# Patient Record
Sex: Male | Born: 1965
Health system: Southern US, Community
[De-identification: ages and names within clinical notes are randomized; demographics above are authoritative.]

## PROBLEM LIST (undated history)

## (undated) DIAGNOSIS — F1910 Other psychoactive substance abuse, uncomplicated: Secondary | ICD-10-CM

## (undated) DIAGNOSIS — F1020 Alcohol dependence, uncomplicated: Secondary | ICD-10-CM

## (undated) DIAGNOSIS — M549 Dorsalgia, unspecified: Secondary | ICD-10-CM

## (undated) DIAGNOSIS — I10 Essential (primary) hypertension: Secondary | ICD-10-CM

## (undated) DIAGNOSIS — E119 Type 2 diabetes mellitus without complications: Secondary | ICD-10-CM

## (undated) DIAGNOSIS — E785 Hyperlipidemia, unspecified: Secondary | ICD-10-CM

## (undated) DIAGNOSIS — I639 Cerebral infarction, unspecified: Secondary | ICD-10-CM

## (undated) HISTORY — DX: Hyperlipidemia, unspecified: E78.5

## (undated) HISTORY — DX: Type 2 diabetes mellitus without complications: E11.9

## (undated) HISTORY — DX: Dorsalgia, unspecified: M54.9

## (undated) HISTORY — PX: HERNIA REPAIR: SHX51

## (undated) HISTORY — PX: WISDOM TOOTH EXTRACTION: SHX21

---

## 2017-10-05 ENCOUNTER — Ambulatory Visit (HOSPITAL_COMMUNITY)
Admission: EM | Admit: 2017-10-05 | Discharge: 2017-10-05 | Disposition: A | Discharge status: Home / Self Care | Payer: Medicaid Other | Attending: Family Medicine | Admitting: Family Medicine

## 2017-10-05 ENCOUNTER — Encounter (HOSPITAL_COMMUNITY): Payer: Self-pay | Admitting: Emergency Medicine

## 2017-10-05 ENCOUNTER — Other Ambulatory Visit: Payer: Self-pay

## 2017-10-05 DIAGNOSIS — H1033 Unspecified acute conjunctivitis, bilateral: Secondary | ICD-10-CM

## 2017-10-05 DIAGNOSIS — R0609 Other forms of dyspnea: Secondary | ICD-10-CM

## 2017-10-05 DIAGNOSIS — R202 Paresthesia of skin: Secondary | ICD-10-CM

## 2017-10-05 HISTORY — DX: Essential (primary) hypertension: I10

## 2017-10-05 HISTORY — DX: Cerebral infarction, unspecified: I63.9

## 2017-10-05 HISTORY — DX: Alcohol dependence, uncomplicated: F10.20

## 2017-10-05 MED ORDER — POLYETHYL GLYCOL-PROPYL GLYCOL 0.4-0.3 % OP SOLN: 1.0000 [drp] | Freq: Four times a day (QID) | OPHTHALMIC | 0 refills | Status: DC | PRN

## 2017-10-05 MED ORDER — OFLOXACIN 0.3 % OP SOLN: OPHTHALMIC | 0 refills | Status: DC

## 2017-10-05 MED ORDER — POLYETHYL GLYCOL-PROPYL GLYCOL 0.4-0.3 % OP GEL: 1.0000 "application " | Freq: Every evening | OPHTHALMIC | 0 refills | Status: DC | PRN

## 2017-10-05 NOTE — ED Triage Notes (Signed)
Pt reports blurred vision that started on Sunday, he also reports drainage to the right eye, and crusted over when he woke up.  He now states he does not have any drainage, more dryness of the eyes, but still complains of blurriness.  He has been in an alcohol rehab program for the last seven months and has no PCP.  He is here with several issues.  He reports SOB with exertion and he also reports numbness in the tips of his fingers bilaterally.

## 2017-10-05 NOTE — Discharge Instructions (Signed)
Use ofloxacin eyedrops as directed on both eyes. Artificial tear drops during the day. Artificial tear gel at night. Wait 10-15 minutes between drops, always use artificial tear gel last, as it prevents drops from penetrating through. Lid scrubs and warm compresses as directed. Monitor for any worsening of symptoms, changes in vision, sensitivity to light, eye swelling, follow up with ophthalmology for further evaluation.   As we discussed, shortness of breath during activity could be due to deconditioning. Other things to consider would be snoring, heart disease, weakness of lungs from smoking. Please go to the emergency department if you experience chest pain, weakness, dizziness, passing out.  Your numbness and tingling of the fingers could be due to inflammation from activity, nerve impingement, diabetes. You can take ibuprofen 400mg  three times a day for 5-10 days to help with the inflammation. Please try to sleep without your arms under the pillow.   Both for your shortness of breath and numbness in your fingers will need further workup. Please follow up with primary care provider for further evaluation and treatment needed.

## 2017-10-05 NOTE — ED Provider Notes (Signed)
[facility]    CSN: [id number] Arrival date & time: 10/05/17  1034     History   Chief Complaint Chief Complaint  Patient presents with  . Eye Problem    bilateral  . Shortness of Breath  . Numbness    HPI Tommy House is a 52 y.o. male.   52 year old male comes in for multiple problems.  1.  4-day history of red eyes and discharge.  Patient states he has had crusting in the morning, and generalized blurry vision.  Denies eye pain, photophobia.  Denies injury/trauma.  States discharge has slightly improved.  He has been applying OTC eyedrops with some relief.  States that he feels his eyes are dry and itchy.  Denies URI symptoms such as cough, congestion, sore throat.  Denies fever, chills, night sweats.  States contact lens use in the past.  Wears glasses currently, last updated last year.  2.  Shortness of breath on exertion.  Patient states he has been in rehab facility for the past few months,   And has had a sedentary lifestyle, with 30 pound weight gain.  He has noticed increased shortness of breath climbing stairs for the past few months.  Denies chest pain, weakness, dizziness, syncope.  Denies personal history of heart disease.  He endorses snoring at night with possibly apnea. Has extensive smoking history of tobacco and THC. History of cocaine use.  Denies orthopnea, swelling of the legs.  3.  Numbness and tingling of the fingers.  Patient states that this has been progressively worse throughout the past few months. He states worse at night. He does sleep with his head on one of his arms, but he states experiences bilateral numbness/tingling. Denies neck, shoulder, wrist pain. Denies injury. Positive family history of diabetes, with personal history of prediabetes. He has not tried anything for the symptoms.       Past Medical History:  Diagnosis Date  . Alcoholism (HCC)   . Hypertension   . Stroke [facility])     There are no active problems to display for  this patient.   Past Surgical History:  Procedure Laterality Date  . HERNIA REPAIR    . WISDOM TOOTH EXTRACTION         Home Medications    Prior to Admission medications   Medication Sig Start Date End Date Taking? Authorizing Provider  ibuprofen (ADVIL,MOTRIN) 200 MG tablet Take 200 mg by mouth every 6 (six) hours as needed.   Yes [provider]  lamoTRIgine (LAMICTAL) 150 MG tablet Take 300 mg by mouth daily.   Yes [provider]  ofloxacin (OCUFLOX) 0.3 % ophthalmic solution Apply 1 drop every 4 hours to both eyes for the first 2 days, then 1 drop in both eyes 4 times a day for 5 days. 10/05/17   Cathie Hoops, Ashling Roane V, PA-C  Polyethyl Glycol-Propyl Glycol (SYSTANE) 0.4-0.3 % GEL ophthalmic gel Place 1 application into both eyes at bedtime as needed. 10/05/17   Cathie Hoops, Laurin Paulo V, PA-C  Polyethyl Glycol-Propyl Glycol (SYSTANE) 0.4-0.3 % SOLN Apply 1-2 drops to eye 4 (four) times daily as needed. 10/05/17   Belinda Fisher, PA-C    Family History History reviewed. No pertinent family history.  Social History Social History   Tobacco Use  . Smoking status: Never Smoker  . Smokeless tobacco: Never Used  Substance Use Topics  . Alcohol use: No    Frequency: Never  . Drug use: No     Allergies  Patient has no known allergies.   Review of Systems Review of Systems  Reason unable to perform ROS: See HPI as above.     Physical Exam Triage Vital Signs ED Triage Vitals  Enc Vitals Group     BP 10/05/17 1128 (!) 150/96     Pulse Rate 10/05/17 1128 94     Resp --      Temp 10/05/17 1128 99 F (37.2 C)     Temp Source 10/05/17 1128 Oral     SpO2 10/05/17 1128 98 %     Weight --      Height --      Head Circumference --      Peak Flow --      Pain Score 10/05/17 1130 0     Pain Loc --      Pain Edu? --      Excl. in GC? --    No data found.  Updated Vital Signs BP (!) 150/96 (BP Location: Left Arm)   Pulse 94   Temp 99 F (37.2 C) (Oral)   SpO2 98%   Visual  Acuity Right Eye Distance:   20/20 (CC) Left Eye Distance:   20/20 (CC) Bilateral Distance:    Right Eye Near:   Left Eye Near:    Bilateral Near:     Physical Exam  Constitutional: He is oriented to person, place, and time. He appears well-developed and well-nourished. No distress.  HENT:  Head: Normocephalic and atraumatic.  Eyes: EOM and lids are normal. Pupils are equal, round, and reactive to light. Lids are everted and swept, no foreign bodies found. Right eye exhibits no discharge. Left eye exhibits no discharge. Right conjunctiva is injected. Left conjunctiva is injected.  No ciliary injection.  Neck: Normal range of motion. Neck supple. No spinous process tenderness and no muscular tenderness present. Normal range of motion present.  Cardiovascular: Normal rate, regular rhythm and normal heart sounds. Exam reveals no gallop and no friction rub.  No murmur heard. Pulmonary/Chest: Effort normal and breath sounds normal. No stridor. He has no decreased breath sounds. He has no wheezes. He has no rales.  Musculoskeletal:  No tenderness on palpation of the spinous processes, shoulders, wrist. Full range of motion of neck, shoulder, wrist, fingers. Strength normal and equal bilaterally. Sensation intact and equal bilaterally.  Radial pulses 2+ and equal bilaterally. Capillary refill less than 2 seconds.   Positive Phalen's.  Negative Tinel's, Finkelstein.  Neurological: He is alert and oriented to person, place, and time.  Skin: Skin is warm and dry.     UC Treatments / Results  Labs (all labs ordered are listed, but only abnormal results are displayed) Labs Reviewed - No data to display  EKG  EKG Interpretation None       Radiology No results found.  Procedures Procedures (including critical care time)  Medications Ordered in UC Medications - No data to display   Initial Impression / Assessment and Plan / UC Course  I have reviewed the triage vital signs and  the nursing notes.  Pertinent labs & imaging results that were available during my care of the patient were reviewed by me and considered in my medical decision making (see chart for details).    Start ofloxacin drops as directed. Artificial tears gel as directed. Lid scrubs and warm compresses as directed. Patient to follow up with ophthalmology if symptoms worsens or does not improve. Return precautions given.   Discussed with patient no alarming  signs on exam today.  Will have to have further workup for dyspnea on exertion as well as paresthesias of the fingers.  Patient try NSAIDs to help with inflammation.  Continued cessation of smoking encouraged.  Patient to follow-up with PCP for further workup and treatment needed.  Return precautions given.  Patient expresses understanding and agrees to plan.   Final Clinical Impressions(s) / UC Diagnoses   Final diagnoses:  Acute conjunctivitis of both eyes, unspecified acute conjunctivitis type  Paresthesia  DOE (dyspnea on exertion)    ED Discharge Orders        Ordered    ofloxacin (OCUFLOX) 0.3 % ophthalmic solution     10/05/17 1215    Polyethyl Glycol-Propyl Glycol (SYSTANE) 0.4-0.3 % GEL ophthalmic gel  At bedtime PRN     10/05/17 1215    Polyethyl Glycol-Propyl Glycol (SYSTANE) 0.4-0.3 % SOLN  4 times daily PRN     10/05/17 1215        Belinda Fisher, PA-C 10/05/17 1445

## 2017-11-01 ENCOUNTER — Emergency Department (HOSPITAL_BASED_OUTPATIENT_CLINIC_OR_DEPARTMENT_OTHER)
Admission: EM | Admit: 2017-11-01 | Discharge: 2017-11-01 | Disposition: A | Discharge status: Home / Self Care | Payer: Medicaid Other | Attending: Emergency Medicine | Admitting: Emergency Medicine

## 2017-11-01 ENCOUNTER — Other Ambulatory Visit: Payer: Self-pay

## 2017-11-01 ENCOUNTER — Encounter (HOSPITAL_BASED_OUTPATIENT_CLINIC_OR_DEPARTMENT_OTHER): Payer: Self-pay | Admitting: Emergency Medicine

## 2017-11-01 ENCOUNTER — Emergency Department (HOSPITAL_BASED_OUTPATIENT_CLINIC_OR_DEPARTMENT_OTHER): Payer: Medicaid Other

## 2017-11-01 DIAGNOSIS — R350 Frequency of micturition: Secondary | ICD-10-CM | POA: Diagnosis not present

## 2017-11-01 DIAGNOSIS — E119 Type 2 diabetes mellitus without complications: Secondary | ICD-10-CM | POA: Diagnosis not present

## 2017-11-01 DIAGNOSIS — Z8673 Personal history of transient ischemic attack (TIA), and cerebral infarction without residual deficits: Secondary | ICD-10-CM | POA: Insufficient documentation

## 2017-11-01 DIAGNOSIS — Z87891 Personal history of nicotine dependence: Secondary | ICD-10-CM | POA: Diagnosis not present

## 2017-11-01 DIAGNOSIS — Z79899 Other long term (current) drug therapy: Secondary | ICD-10-CM | POA: Diagnosis not present

## 2017-11-01 DIAGNOSIS — I10 Essential (primary) hypertension: Secondary | ICD-10-CM | POA: Diagnosis not present

## 2017-11-01 DIAGNOSIS — R42 Dizziness and giddiness: Secondary | ICD-10-CM | POA: Diagnosis not present

## 2017-11-01 DIAGNOSIS — R631 Polydipsia: Secondary | ICD-10-CM | POA: Diagnosis not present

## 2017-11-01 DIAGNOSIS — E1165 Type 2 diabetes mellitus with hyperglycemia: Secondary | ICD-10-CM | POA: Insufficient documentation

## 2017-11-01 DIAGNOSIS — R200 Anesthesia of skin: Secondary | ICD-10-CM | POA: Diagnosis not present

## 2017-11-01 DIAGNOSIS — H538 Other visual disturbances: Secondary | ICD-10-CM | POA: Diagnosis present

## 2017-11-01 HISTORY — DX: Other psychoactive substance abuse, uncomplicated: F19.10

## 2017-11-01 LAB — CBG MONITORING, ED
Glucose-Capillary: 535 mg/dL (ref 65–99)
Glucose-Capillary: 338 mg/dL — ABNORMAL HIGH (ref 65–99)

## 2017-11-01 LAB — COMPREHENSIVE METABOLIC PANEL
ALBUMIN: 4.4 g/dL (ref 3.5–5.0)
ALK PHOS: 148 U/L — AB (ref 38–126)
ALT: 53 U/L (ref 17–63)
AST: 22 U/L (ref 15–41)
Anion gap: 12 (ref 5–15)
BUN: 21 mg/dL — AB (ref 6–20)
CHLORIDE: 94 mmol/L — AB (ref 101–111)
CO2: 25 mmol/L (ref 22–32)
CREATININE: 1.05 mg/dL (ref 0.61–1.24)
Calcium: 9.5 mg/dL (ref 8.9–10.3)
GFR calc Af Amer: >60 mL/min (ref >60)
GFR calc non Af Amer: >60 mL/min (ref >60)
GLUCOSE: 509 mg/dL — AB (ref 65–99)
Potassium: 4 mmol/L (ref 3.5–5.1)
SODIUM: 131 mmol/L — AB (ref 135–145)
Total Bilirubin: 1.4 mg/dL — ABNORMAL HIGH (ref 0.3–1.2)
Total Protein: 7.5 g/dL (ref 6.5–8.1)

## 2017-11-01 LAB — CBC WITH DIFFERENTIAL/PLATELET
Basophils Absolute: 0 10*3/uL (ref 0.0–0.1)
Basophils Relative: 1 %
EOS ABS: 0.1 10*3/uL (ref 0.0–0.7)
EOS PCT: 1 %
HCT: 43.4 % (ref 39.0–52.0)
HEMOGLOBIN: 16 g/dL (ref 13.0–17.0)
LYMPHS ABS: 1.7 10*3/uL (ref 0.7–4.0)
Lymphocytes Relative: 26 %
MCH: 29.1 pg (ref 26.0–34.0)
MCHC: 36.9 g/dL — AB (ref 30.0–36.0)
MCV: 78.9 fL (ref 78.0–100.0)
MONO ABS: 0.8 10*3/uL (ref 0.1–1.0)
MONOS PCT: 13 %
NEUTROS PCT: 59 %
Neutro Abs: 3.9 10*3/uL (ref 1.7–7.7)
Platelets: 228 10*3/uL (ref 150–400)
RBC: 5.5 MIL/uL (ref 4.22–5.81)
RDW: 12.6 % (ref 11.5–15.5)
WBC: 6.6 10*3/uL (ref 4.0–10.5)

## 2017-11-01 LAB — URINALYSIS, ROUTINE W REFLEX MICROSCOPIC
BILIRUBIN URINE: NEGATIVE
Glucose, UA: >=500 mg/dL — AB
HGB URINE DIPSTICK: NEGATIVE
Ketones, ur: 15 mg/dL — AB
Leukocytes, UA: NEGATIVE
Nitrite: NEGATIVE
Protein, ur: NEGATIVE mg/dL
SPECIFIC GRAVITY, URINE: 1.01 (ref 1.005–1.030)
pH: 6 (ref 5.0–8.0)

## 2017-11-01 LAB — I-STAT VENOUS BLOOD GAS, ED
Bicarbonate: 24.9 mmol/L (ref 20.0–28.0)
O2 SAT: 69 %
PH VEN: 7.404 (ref 7.250–7.430)
PO2 VEN: 36 mmHg (ref 32.0–45.0)
TCO2: 26 mmol/L (ref 22–32)
pCO2, Ven: 39.7 mmHg — ABNORMAL LOW (ref 44.0–60.0)

## 2017-11-01 LAB — URINALYSIS, MICROSCOPIC (REFLEX)

## 2017-11-01 MED ORDER — BLOOD GLUCOSE MONITOR KIT: PACK | 0 refills | Status: DC

## 2017-11-01 MED ORDER — SODIUM CHLORIDE 0.9 % IV BOLUS (SEPSIS)
1000.0000 mL | Freq: Once | INTRAVENOUS | Status: AC
  Administered 2017-11-01: 1000 mL via INTRAVENOUS

## 2017-11-01 MED ORDER — METFORMIN HCL 500 MG PO TABS
500.0000 mg | ORAL_TABLET | Freq: Once | ORAL | Status: AC
  Administered 2017-11-01: 500 mg via ORAL
  Filled 2017-11-01: qty 1

## 2017-11-01 MED ORDER — METFORMIN HCL 500 MG PO TABS: 500.0000 mg | ORAL_TABLET | Freq: Two times a day (BID) | ORAL | 1 refills | Status: DC

## 2017-11-01 MED ORDER — IOPAMIDOL (ISOVUE-370) INJECTION 76%
100.0000 mL | Freq: Once | INTRAVENOUS | Status: AC | PRN
  Administered 2017-11-01: 100 mL via INTRAVENOUS

## 2017-11-01 MED FILL — TRUE METRIX BLOOD GLUCOSE M: W/DEVICE | 30 days supply | Qty: 1 | Fill #0

## 2017-11-01 MED FILL — metFORMIN HCL 500 MG TABS: 500 | 30 days supply | Qty: 60 | Fill #0

## 2017-11-01 MED FILL — TRUEplus LANCETS 30G MISC: 25 days supply | Qty: 100 | Fill #0

## 2017-11-01 NOTE — ED Triage Notes (Signed)
Numbness of left fingertips since December.  Blurring of vision x3 weeks.

## 2017-11-01 NOTE — ED Notes (Signed)
ED Provider at bedside. 

## 2017-11-01 NOTE — ED Provider Notes (Signed)
[facility]ERGENCY DEPARTMENT Provider Note   CSN: [id number] Arrival date & time: 11/01/17  1033     History   Chief Complaint Chief Complaint  Patient presents with  . Numbness    HPI Tommy House is a 52 y.o. male.  HPI  52 year old male with a history of prior drug abuse and alcoholism status post rehab presents with multiple complaints.  For about 1 month he has had multiple issues including bilateral blurry vision despite wearing his glasses.  He is also had increased thirst and increased urination.  Has had numbness at the tips of his fingers from his thumb to his ring finger of the left hand.  Proximal to the tips they feel normal.  Has had some numbness to right hand fingertips but not as bad. This has been a constant sensation for about 1 month.  He went to urgent care and was diagnosed with conjunctivitis and his eye redness and other changes went away with treatment.  However he still has the blurry vision.  He has had some dizziness.  He has chronic low back pain but this is unchanged from prior.  He was told he is a prediabetic and while in rehab has had some blood glucoses checked and were typically in the 150 range.  He also mentions about 3 days ago he had a headache all day that was very intense.  It did not get better or worse throughout the day.  Went away on its own 2 days ago.  No vomiting.  Past Medical History:  Diagnosis Date  . Alcoholism ([place])   . Drug abuse ([place])   . Hypertension   . Stroke [facility]     There are no active problems to display for this patient.   Past Surgical History:  Procedure Laterality Date  . HERNIA REPAIR    . WISDOM TOOTH EXTRACTION         Home Medications    Prior to Admission medications   Medication Sig Start Date End Date Taking? Authorizing Provider  blood glucose meter kit and supplies KIT Dispense based on patient and insurance preference. Use up to four times daily as directed. (FOR ICD-9 250.00,  250.01). 11/01/17   Sherwood Gambler, MD  ibuprofen (ADVIL,MOTRIN) 200 MG tablet Take 200 mg by mouth every 6 (six) hours as needed.    [provider]  lamoTRIgine (LAMICTAL) 150 MG tablet Take 300 mg by mouth daily.    [provider]  metFORMIN (GLUCOPHAGE) 500 MG tablet Take 1 tablet (500 mg total) by mouth 2 (two) times daily with a meal. 11/01/17   Sherwood Gambler, MD  ofloxacin (OCUFLOX) 0.3 % ophthalmic solution Apply 1 drop every 4 hours to both eyes for the first 2 days, then 1 drop in both eyes 4 times a day for 5 days. 10/05/17   Tasia Catchings, Amy V, PA-C  Polyethyl Glycol-Propyl Glycol (SYSTANE) 0.4-0.3 % GEL ophthalmic gel Place 1 application into both eyes at bedtime as needed. 10/05/17   Tasia Catchings, Amy V, PA-C  Polyethyl Glycol-Propyl Glycol (SYSTANE) 0.4-0.3 % SOLN Apply 1-2 drops to eye 4 (four) times daily as needed. 10/05/17   Ok Edwards, PA-C    Family History No family history on file.  Social History Social History   Tobacco Use  . Smoking status: Former Research scientist (life sciences)  . Smokeless tobacco: Never Used  Substance Use Topics  . Alcohol use: No    Frequency: Never  . Drug use: No  Comment: Former drug and alcohol use.     Allergies   Patient has no known allergies.   Review of Systems Review of Systems  Eyes: Positive for visual disturbance.  Respiratory: Negative for cough.   Cardiovascular: Negative for chest pain.  Gastrointestinal: Negative for abdominal pain and vomiting.  Endocrine: Positive for polydipsia and polyuria.  Genitourinary: Positive for frequency. Negative for dysuria.  Musculoskeletal: Positive for back pain (chronic).  Neurological: Positive for dizziness, numbness and headaches. Negative for weakness.  All other systems reviewed and are negative.    Physical Exam Updated Vital Signs BP 136/85 (BP Location: Left Arm)   Pulse 81   Temp 98.6 F (37 C) (Oral)   Resp 16   Wt 88.1 kg (194 lb 3.6 oz)   SpO2 98%   Physical Exam  Constitutional:  He is oriented to person, place, and time. He appears well-developed and well-nourished. No distress.  HENT:  Head: Normocephalic and atraumatic.  Right Ear: External ear normal.  Left Ear: External ear normal.  Nose: Nose normal.  Eyes: EOM are normal. Pupils are equal, round, and reactive to light. Right eye exhibits no discharge. Left eye exhibits no discharge.  Neck: Neck supple.  Cardiovascular: Normal rate, regular rhythm and normal heart sounds.  Pulmonary/Chest: Effort normal and breath sounds normal.  Abdominal: Soft. He exhibits no distension. There is no tenderness.  Musculoskeletal: He exhibits no edema.  Neurological: He is alert and oriented to person, place, and time.  CN 3-12 grossly intact. 5/5 strength in all 4 extremities. Grossly normal sensation save for decreased sensation over finger tips 1-4 of left hand. Rest of hand sensation normal. Normal finger to nose.   Skin: Skin is warm and dry. He is not diaphoretic.  Nursing note and vitals reviewed.    ED Treatments / Results  Labs (all labs ordered are listed, but only abnormal results are displayed) Labs Reviewed  COMPREHENSIVE METABOLIC PANEL - Abnormal; Notable for the following components:      Result Value   Sodium 131 (*)    Chloride 94 (*)    Glucose, Bld 509 (*)    BUN 21 (*)    Alkaline Phosphatase 148 (*)    Total Bilirubin 1.4 (*)    All other components within normal limits  CBC WITH DIFFERENTIAL/PLATELET - Abnormal; Notable for the following components:   MCHC 36.9 (*)    All other components within normal limits  URINALYSIS, ROUTINE W REFLEX MICROSCOPIC - Abnormal; Notable for the following components:   Glucose, UA >=500 (*)    Ketones, ur 15 (*)    All other components within normal limits  URINALYSIS, MICROSCOPIC (REFLEX) - Abnormal; Notable for the following components:   Bacteria, UA RARE (*)    Squamous Epithelial / LPF 0-5 (*)    All other components within normal limits  CBG  MONITORING, ED - Abnormal; Notable for the following components:   Glucose-Capillary 535 (*)    All other components within normal limits  I-STAT VENOUS BLOOD GAS, ED - Abnormal; Notable for the following components:   pCO2, Ven 39.7 (*)    All other components within normal limits  CBG MONITORING, ED - Abnormal; Notable for the following components:   Glucose-Capillary 338 (*)    All other components within normal limits    EKG  EKG Interpretation None       Radiology Ct Angio Head W Or Wo Contrast  Result Date: 11/01/2017 CLINICAL DATA:  52 year old male with  worst headache of life. Acute severe headache. EXAM: CT ANGIOGRAPHY HEAD AND NECK TECHNIQUE: Multidetector CT imaging of the head and neck was performed using the standard protocol during bolus administration of intravenous contrast. Multiplanar CT image reconstructions and MIPs were obtained to evaluate the vascular anatomy. Carotid stenosis measurements (when applicable) are obtained utilizing NASCET criteria, using the distal internal carotid diameter as the denominator. CONTRAST:  151m ISOVUE-370 IOPAMIDOL (ISOVUE-370) INJECTION 76% COMPARISON:  None. FINDINGS: CT HEAD Brain: No midline shift, ventriculomegaly, mass effect, evidence of mass lesion, intracranial hemorrhage or evidence of cortically based acute infarction. Gray-white matter differentiation is within normal limits throughout the brain. Calvarium and skull base: No acute osseous abnormality identified. Paranasal sinuses: Mild to moderate bilateral ethmoid and left maxillary sinus mucosal thickening with some bubbly opacity. Frontal sinuses and sphenoid sinuses are well pneumatized. Tympanic cavities and mastoids are clear. Orbits: Visualized orbits and scalp soft tissues are within normal limits. CTA NECK Skeleton: Widespread advanced cervical spine degeneration. Disc space loss with bulky endplate spurring. Widespread facet hypertrophy. Mild degenerative  spondylolisthesis at C2-C3 associated with severe facet degeneration on the right including vacuum facet. No acute osseous abnormality identified. Upper chest: Negative visualized superior mediastinum aside from evidence of some calcified left coronary artery atherosclerosis (series 9, image 2). Negative visible lung parenchyma. Other neck: Symmetric appearing adenoid hypertrophy. Other neck soft tissues are within normal limits. No cervical lymphadenopathy. Aortic arch: Negative aortic arch.  Three vessel arch configuration. Right carotid system: Dense contrast streak artifact from the right subclavian vein mildly limits visualization of the right CCA origin. The right CCA appears normal aside from mild tortuosity. Normal right carotid bifurcation. Normal cervical right ICA. Left carotid system: Mild soft and calcified plaque at the left ICA origin and bulb without stenosis. Otherwise negative. Vertebral arteries: No proximal right subclavian artery stenosis. The right vertebral artery origin appears normal on series 12, image 152. Negative right vertebral artery to the skull base. No proximal left subclavian artery stenosis. The left vertebral artery origin is tortuous with no definite stenosis. The left vertebral artery is patent to the skull base without stenosis. CTA HEAD Posterior circulation: Mild calcified plaque right vertebral artery V4 segment without stenosis. Normal right PICA origin. No distal left vertebral artery stenosis. Patent left PICA origin. Patent vertebrobasilar junction. Patent basilar artery without stenosis. Normal SCA and right PCA origins. Fetal type left PCA origin. Right Posterior communicating artery is diminutive or absent. PCA branches are within normal limits. Anterior circulation: Both ICA siphons are patent without plaque or stenosis. Normal left posterior communicating artery origin. Patent carotid termini. Normal MCA and ACA origins. Mildly tortuous A1 segments. Diminutive or  absent anterior communicating artery. Bilateral ACA branches are within normal limits. Left MCA M1 segment, bifurcation, and left MCA branches are within normal limits. Right MCA M1 segment, trifurcation, and right MCA branches are within normal limits. Venous sinuses: Patent. Anatomic variants: Fetal left PCA origin. Delayed phase: No abnormal enhancement identified. Review of the MIP images confirms the above findings IMPRESSION: 1. No head or neck arterial occlusion, stenosis, dissection, or aneurysm. Mild atherosclerosis at the left ICA origin and right vertebral artery V4 segment. 2. Normal CT appearance of the brain. 3. Severe chronic cervical spine degeneration. 4. Evidence of calcified left coronary artery atherosclerosis Electronically Signed   By: HGenevie AnnM.D.   On: 11/01/2017 12:47   Ct Angio Neck W And/or Wo Contrast  Result Date: 11/01/2017 CLINICAL DATA:  52year old male with worst headache  of life. Acute severe headache. EXAM: CT ANGIOGRAPHY HEAD AND NECK TECHNIQUE: Multidetector CT imaging of the head and neck was performed using the standard protocol during bolus administration of intravenous contrast. Multiplanar CT image reconstructions and MIPs were obtained to evaluate the vascular anatomy. Carotid stenosis measurements (when applicable) are obtained utilizing NASCET criteria, using the distal internal carotid diameter as the denominator. CONTRAST:  173m ISOVUE-370 IOPAMIDOL (ISOVUE-370) INJECTION 76% COMPARISON:  None. FINDINGS: CT HEAD Brain: No midline shift, ventriculomegaly, mass effect, evidence of mass lesion, intracranial hemorrhage or evidence of cortically based acute infarction. Gray-white matter differentiation is within normal limits throughout the brain. Calvarium and skull base: No acute osseous abnormality identified. Paranasal sinuses: Mild to moderate bilateral ethmoid and left maxillary sinus mucosal thickening with some bubbly opacity. Frontal sinuses and sphenoid  sinuses are well pneumatized. Tympanic cavities and mastoids are clear. Orbits: Visualized orbits and scalp soft tissues are within normal limits. CTA NECK Skeleton: Widespread advanced cervical spine degeneration. Disc space loss with bulky endplate spurring. Widespread facet hypertrophy. Mild degenerative spondylolisthesis at C2-C3 associated with severe facet degeneration on the right including vacuum facet. No acute osseous abnormality identified. Upper chest: Negative visualized superior mediastinum aside from evidence of some calcified left coronary artery atherosclerosis (series 9, image 2). Negative visible lung parenchyma. Other neck: Symmetric appearing adenoid hypertrophy. Other neck soft tissues are within normal limits. No cervical lymphadenopathy. Aortic arch: Negative aortic arch.  Three vessel arch configuration. Right carotid system: Dense contrast streak artifact from the right subclavian vein mildly limits visualization of the right CCA origin. The right CCA appears normal aside from mild tortuosity. Normal right carotid bifurcation. Normal cervical right ICA. Left carotid system: Mild soft and calcified plaque at the left ICA origin and bulb without stenosis. Otherwise negative. Vertebral arteries: No proximal right subclavian artery stenosis. The right vertebral artery origin appears normal on series 12, image 152. Negative right vertebral artery to the skull base. No proximal left subclavian artery stenosis. The left vertebral artery origin is tortuous with no definite stenosis. The left vertebral artery is patent to the skull base without stenosis. CTA HEAD Posterior circulation: Mild calcified plaque right vertebral artery V4 segment without stenosis. Normal right PICA origin. No distal left vertebral artery stenosis. Patent left PICA origin. Patent vertebrobasilar junction. Patent basilar artery without stenosis. Normal SCA and right PCA origins. Fetal type left PCA origin. Right Posterior  communicating artery is diminutive or absent. PCA branches are within normal limits. Anterior circulation: Both ICA siphons are patent without plaque or stenosis. Normal left posterior communicating artery origin. Patent carotid termini. Normal MCA and ACA origins. Mildly tortuous A1 segments. Diminutive or absent anterior communicating artery. Bilateral ACA branches are within normal limits. Left MCA M1 segment, bifurcation, and left MCA branches are within normal limits. Right MCA M1 segment, trifurcation, and right MCA branches are within normal limits. Venous sinuses: Patent. Anatomic variants: Fetal left PCA origin. Delayed phase: No abnormal enhancement identified. Review of the MIP images confirms the above findings IMPRESSION: 1. No head or neck arterial occlusion, stenosis, dissection, or aneurysm. Mild atherosclerosis at the left ICA origin and right vertebral artery V4 segment. 2. Normal CT appearance of the brain. 3. Severe chronic cervical spine degeneration. 4. Evidence of calcified left coronary artery atherosclerosis Electronically Signed   By: HGenevie AnnM.D.   On: 11/01/2017 12:47    Procedures Procedures (including critical care time)  Medications Ordered in ED Medications  sodium chloride 0.9 % bolus 1,000 mL (  0 mLs Intravenous Stopped 11/01/17 1250)  sodium chloride 0.9 % bolus 1,000 mL (0 mLs Intravenous Stopped 11/01/17 1353)  iopamidol (ISOVUE-370) 76 % injection 100 mL (100 mLs Intravenous Contrast Given 11/01/17 1214)  metFORMIN (GLUCOPHAGE) tablet 500 mg (500 mg Oral Given 11/01/17 1234)     Initial Impression / Assessment and Plan / ED Course  I have reviewed the triage vital signs and the nursing notes.  Pertinent labs & imaging results that were available during my care of the patient were reviewed by me and considered in my medical decision making (see chart for details).     Patient symptoms are most easily explained by the glucose of over 500.  This is a.  He was given  2 L IV fluids with good response and glucose now under 350.  He does not have evidence of acidosis with a normal bicarbonate and anion gap of 12.  Otherwise his renal function is baseline.  I was able to obtain labs from his PCP in [place] state which shows a baseline creatinine of 1.  He is feeling somewhat better.  Unclear where this headache came from or what was causing it 3 days ago but given how severe he described it, CT and CTA obtained to help rule out aneurysm.  Given this is negative I do not think LP would be beneficial.  He is otherwise stable for discharge and outpatient follow-up.  He states he is leaving the country in 3 months and is not sure if and when he will return.  I advised him to get a PCP before then to help either adjust medications or continue outpatient management of diabetes.  He will be started on metformin 500 mg twice daily.  Counseled on diet and exercise changes.  Otherwise he shows no acute strokelike symptoms or signs of a neurologic emergency.  He will be given ophthalmology follow-up for the blurry vision which may be just from the high glucose but could be pathologic from the diabetes.  Otherwise, well-appearing and is stable for discharge home.  Final Clinical Impressions(s) / ED Diagnoses   Final diagnoses:  Type 2 diabetes mellitus with hyperglycemia, without long-term current use of insulin ([place])  New onset type 2 diabetes mellitus [facility])    ED Discharge Orders        Ordered    metFORMIN (GLUCOPHAGE) 500 MG tablet  2 times daily with meals     11/01/17 1357    blood glucose meter kit and supplies KIT     11/01/17 1358       Sherwood Gambler, MD 11/01/17 1455

## 2017-11-01 NOTE — ED Notes (Signed)
ED Provider at bedside discussing the test results and dispo plan of care.

## 2017-11-08 ENCOUNTER — Encounter: Payer: Self-pay | Admitting: Family Medicine

## 2017-11-08 ENCOUNTER — Ambulatory Visit (INDEPENDENT_AMBULATORY_CARE_PROVIDER_SITE_OTHER): Payer: Medicaid Other | Admitting: Family Medicine

## 2017-11-08 VITALS — BP 134/96 | HR 84 | Temp 98.0°F | Resp 16 | Ht 68.0 in | Wt 195.0 lb

## 2017-11-08 DIAGNOSIS — E119 Type 2 diabetes mellitus without complications: Secondary | ICD-10-CM | POA: Diagnosis not present

## 2017-11-08 DIAGNOSIS — Z8673 Personal history of transient ischemic attack (TIA), and cerebral infarction without residual deficits: Secondary | ICD-10-CM

## 2017-11-08 DIAGNOSIS — R635 Abnormal weight gain: Secondary | ICD-10-CM

## 2017-11-08 DIAGNOSIS — R739 Hyperglycemia, unspecified: Secondary | ICD-10-CM | POA: Diagnosis not present

## 2017-11-08 DIAGNOSIS — F1021 Alcohol dependence, in remission: Secondary | ICD-10-CM

## 2017-11-08 LAB — POCT URINALYSIS DIP (DEVICE)
BILIRUBIN URINE: NEGATIVE
GLUCOSE, UA: 500 mg/dL — AB
Hgb urine dipstick: NEGATIVE
Ketones, ur: 40 mg/dL — AB
Leukocytes, UA: NEGATIVE
NITRITE: NEGATIVE
PH: 5.5 (ref 5.0–8.0)
Protein, ur: NEGATIVE mg/dL
Specific Gravity, Urine: 1.01 (ref 1.005–1.030)
Urobilinogen, UA: 0.2 mg/dL (ref 0.0–1.0)

## 2017-11-08 LAB — GLUCOSE, CAPILLARY
GLUCOSE-CAPILLARY: 338 mg/dL — AB (ref 65–99)
GLUCOSE-CAPILLARY: 326 mg/dL — AB (ref 65–99)

## 2017-11-08 LAB — POCT GLYCOSYLATED HEMOGLOBIN (HGB A1C): HEMOGLOBIN A1C: 12.9

## 2017-11-08 MED ORDER — ASPIRIN EC 81 MG PO TBEC: 81.0000 mg | DELAYED_RELEASE_TABLET | Freq: Every day | ORAL | 11 refills | Status: DC

## 2017-11-08 MED ORDER — "INSULIN SYRINGE-NEEDLE U-100 31G X 5/16"" 0.5 ML MISC": 1.0000 | Freq: Two times a day (BID) | 1 refills | Status: DC | PRN

## 2017-11-08 MED ORDER — METFORMIN HCL 1000 MG PO TABS: 1000.0000 mg | ORAL_TABLET | Freq: Two times a day (BID) | ORAL | 5 refills | Status: DC

## 2017-11-08 MED ORDER — INSULIN GLARGINE 100 UNIT/ML SOLOSTAR PEN: 20.0000 [IU] | PEN_INJECTOR | Freq: Every day | SUBCUTANEOUS | 11 refills | Status: DC

## 2017-11-08 MED ORDER — NON FORMULARY
10.0000 [IU] | Freq: Once | Status: AC
  Administered 2017-11-08: 10 [IU] via SUBCUTANEOUS

## 2017-11-08 MED FILL — !LANTUS SOLOSTAR 100UNITS/M: 100 | 30 days supply | Qty: 6 | Fill #0

## 2017-11-08 MED FILL — ?METFORMIN HCL 1,000 MG TAB: 1000 | 30 days supply | Qty: 60 | Fill #0

## 2017-11-08 NOTE — Progress Notes (Signed)
Subjective:    Patient ID: Tommy House, male    DOB: 08/01/1966, 52 y.o.   MRN: [id number]  HPI Tommy House, a 52 year old male presents to establish care.  [place]. Tommy [place] to undergo a drug and alcohol rehabilitation program.  He recently moved into a new facility to promote ongoing recovery from alcoholism and drug abuse.  Patient was evaluated in the emergency department on 11/01/2017 for worsening vision despite wearing his glasses, increased thirst and increased urination.  Patient was found to have a CBG greater than 500.  He was started on metformin 500 mg twice daily.  He has been taking medication consistently since hospital discharge.  Patient endorses polyuria.  He states that polydipsia and polyphagia have improved.  He has been following a carbohydrate modify diet and has been researching type 2 diabetes since initial diagnosis.  Patient states that he was diagnosed with prediabetes by primary provider several years ago.  He states that while in the drug rehabilitation program a great deal of sweets and simple carbohydrates were offered during meals.  He states that he had no control over diet and did not exercise during the rehab program.  He gained a total of 25 pounds during this time.  He does not exercise routinely.  Blood sugar markedly elevated on arrival.  Current CBG is 380.  Patient has been checking blood sugars at home, which have been consistently greater than 200.   Past Medical History:  Diagnosis Date  . Alcoholism ([facility])   . Drug abuse ([facility])   . Hypertension   . Stroke [facility]CC)    Social History   Socioeconomic History  . Marital status: Single    Spouse name: Not on file  . Number of children: Not on file  . Years of education: Not on file  . Highest education level: Not on file  Social Needs  . Financial resource strain: Not on file  . Food insecurity - worry: Not on file  . Food insecurity - inability: Not on file  .  Transportation needs - medical: Not on file  . Transportation needs - non-medical: Not on file  Occupational History  . Not on file  Tobacco Use  . Smoking status: Former Games developermoker  . Smokeless tobacco: Never Used  Substance and Sexual Activity  . Alcohol use: No    Frequency: Never  . Drug use: No    Comment: Former drug and alcohol use.  Marland Kitchen. Sexual activity: Not on file  Other Topics Concern  . Not on file  Social History Narrative  . Not on file   There is no immunization history on file for this patient.  No Known Allergies Review of Systems  Constitutional: Negative.  Negative for fatigue and fever.  HENT: Negative.   Eyes: Negative.   Respiratory: Negative.   Cardiovascular: Negative.   Gastrointestinal: Negative.   Endocrine: Positive for polydipsia, polyphagia and polyuria.  Genitourinary: Negative.  Negative for decreased urine volume, scrotal swelling, testicular pain and urgency.  Musculoskeletal: Negative.   Skin: Negative.  Negative for rash and wound.  Neurological: Positive for dizziness.  Hematological: Negative.   Psychiatric/Behavioral: Negative.        Objective:   Physical Exam  Constitutional: He is oriented to person, place, and time.  HENT:  Head: Normocephalic and atraumatic.  Right Ear: External ear normal.  Left Ear: External ear normal.  Nose: Nose normal.  Mouth/Throat: Oropharynx is clear and moist.  Eyes: Pupils are equal, round, and reactive to light.  Neck: Normal range of motion. Neck supple.  Cardiovascular: Normal rate, normal heart sounds and intact distal pulses.  Pulmonary/Chest: Effort normal and breath sounds normal.  Abdominal: Soft. Bowel sounds are normal.  Neurological: He is alert and oriented to person, place, and time. He has normal reflexes.  Skin: Skin is warm and dry.  Psychiatric: He has a normal mood and affect. His behavior is normal. Judgment and thought content normal.     BP (!) 134/96 (BP Location: Left Arm,  Patient Position: Sitting, Cuff Size: Large)   Pulse 84   Temp 98 F (36.7 C) (Oral)   Resp 16   Ht 5\' 8"  (1.727 m)   Wt 195 lb (88.5 kg)   SpO2 98%   BMI 29.65 kg/m  Assessment & Plan:  1. Type 2 diabetes mellitus without complication, without long-term current use of insulin ([facility]) Hemoglobin A1c is 12.9, which is above goal.  Goal is less than 7.  Will start Lantus 20 units at bedtime.  We will also increase metformin to 1000 mg twice daily with meals. Recommend that patient check blood sugars prior to meals and at bedtime.  Maintain a blood sugar diary and bring back in 4 weeks for follow-up. Demonstrated through teach back method Discussed carbohydrate modify diet and exercise regimen at length.  Given written information, patient expressed understanding. - Glucose, capillary - POCT urinalysis dip (device) - Microalbumin/Creatinine Ratio, Urine - Hemoglobin A1c - HgB A1c - Insulin Syringe-Needle U-100 (B-D INS SYRINGE 0.5CC/31GX5/16) 31G X 5/16" 0.5 ML MISC; 1 each by Does not apply route 3 times/day as needed-between meals & bedtime.  Dispense: 100 each; Refill: 1 - Insulin Glargine (LANTUS SOLOSTAR) 100 UNIT/ML Solostar Pen; Inject 20 Units into the skin daily at 10 pm.  Dispense: 5 pen; Refill: 11 - Lipid Panel - metFORMIN (GLUCOPHAGE) 1000 MG tablet; Take 1 tablet (1,000 mg total) by mouth 2 (two) times daily with a meal.  Dispense: 60 tablet; Refill: 5 - NON FORMULARY 10 Units - Glucose, capillary  2. Hyperglycemia Blood sugar 380 on arrival patient was administered 10 units of Humalog blood sugar decreased to 324 prior to discharge.  Recommend that he start medication regimen on today.  3. Weight gain The patient is asked to make an attempt to improve diet and exercise patterns to aid in medical management of this problem. - TSH  4. History of CVA (cerebrovascular accident) We will start ASA therapy. - Lipid Panel - aspirin EC 81 MG tablet; Take 1 tablet (81 mg total)  by mouth daily.  Dispense: 30 tablet; Refill: 11  5. History of alcohol dependence (HCC) Continue structured alcohol dependence program.  Patient has been alcohol free for greater than 7 months.   Greater than 50% of visit spent face-to-face discussing type 2 diabetes diagnosis, demonstrating how to administer insulin.  RTC: 4 weeks for type 2 diabetes mellitus follow-up  The patient was given clear instructions to go to ER or return to medical center if symptoms do not improve, worsen or new problems develop. The patient verbalized understanding. Will notify patient with laboratory results.  Nolon Nations  MSN, FNP-C Patient Care [facility] [address] [place], [place] [address] [phone]

## 2017-11-08 NOTE — Patient Instructions (Addendum)
You have new onset type 2 diabetes mellitus.  Your hemoglobin A1c is 12.2, which is above goal.  According to the American diabetes Association your A1c goal is less than 7.  Your fasting blood sugar daily should be between 110-140 and 3 hours after meals less than 200. Recommend a carbohydrate modify diet divided over 5-6 small meals throughout the day.  Also exercise 150 minutes/week.  Increase water intake to 6-8 glasses/day. Return in 4 weeks with diabetic diary.  I have included information pertaining to hypoglycemia and hyperglycemia. We will also start lisinopril 2.5 mg daily. Carbohydrate Counting for Diabetes Mellitus, Adult Carbohydrate counting is a method for keeping track of how many carbohydrates you eat. Eating carbohydrates naturally increases the amount of sugar (glucose) in the blood. Counting how many carbohydrates you eat helps keep your blood glucose within normal limits, which helps you manage your diabetes (diabetes mellitus). It is important to know how many carbohydrates you can safely have in each meal. This is different for every person. A diet and nutrition specialist (registered dietitian) can help you make a meal plan and calculate how many carbohydrates you should have at each meal and snack. Carbohydrates are found in the following foods:  Grains, such as breads and cereals.  Dried beans and soy products.  Starchy vegetables, such as potatoes, peas, and corn.  Fruit and fruit juices.  Milk and yogurt.  Sweets and snack foods, such as cake, cookies, candy, chips, and soft drinks.  How do I count carbohydrates? There are two ways to count carbohydrates in food. You can use either of the methods or a combination of both. Reading "Nutrition Facts" on packaged food The "Nutrition Facts" list is included on the labels of almost all packaged foods and beverages in the [place] It includes:  The serving size.  Information about nutrients in each serving, including the  grams (g) of carbohydrate per serving.  To use the "Nutrition Facts":  Decide how many servings you will have.  Multiply the number of servings by the number of carbohydrates per serving.  The resulting number is the total amount of carbohydrates that you will be having.  Learning standard serving sizes of other foods When you eat foods containing carbohydrates that are not packaged or do not include "Nutrition Facts" on the label, you need to measure the servings in order to count the amount of carbohydrates:  Measure the foods that you will eat with a food scale or measuring cup, if needed.  Decide how many standard-size servings you will eat.  Multiply the number of servings by 15. Most carbohydrate-rich foods have about 15 g of carbohydrates per serving. ? For example, if you eat 8 oz (170 g) of strawberries, you will have eaten 2 servings and 30 g of carbohydrates (2 servings x 15 g = 30 g).  For foods that have more than one food mixed, such as soups and casseroles, you must count the carbohydrates in each food that is included.  The following list contains standard serving sizes of common carbohydrate-rich foods. Each of these servings has about 15 g of carbohydrates:   hamburger bun or  English muffin.   oz (15 mL) syrup.   oz (14 g) jelly.  1 slice of bread.  1 six-inch tortilla.  3 oz (85 g) cooked rice or pasta.  4 oz (113 g) cooked dried beans.  4 oz (113 g) starchy vegetable, such as peas, corn, or potatoes.  4 oz (113 g) hot  cereal.  4 oz (113 g) mashed potatoes or  of a large baked potato.  4 oz (113 g) canned or frozen fruit.  4 oz (120 mL) fruit juice.  4-6 crackers.  6 chicken nuggets.  6 oz (170 g) unsweetened dry cereal.  6 oz (170 g) plain fat-free yogurt or yogurt sweetened with artificial sweeteners.  8 oz (240 mL) milk.  8 oz (170 g) fresh fruit or one small piece of fruit.  24 oz (680 g) popped popcorn.  Example of  carbohydrate counting Sample meal  3 oz (85 g) chicken breast.  6 oz (170 g) brown rice.  4 oz (113 g) corn.  8 oz (240 mL) milk.  8 oz (170 g) strawberries with sugar-free whipped topping. Carbohydrate calculation 1. Identify the foods that contain carbohydrates: ? Rice. ? Corn. ? Milk. ? Strawberries. 2. Calculate how many servings you have of each food: ? 2 servings rice. ? 1 serving corn. ? 1 serving milk. ? 1 serving strawberries. 3. Multiply each number of servings by 15 g: ? 2 servings rice x 15 g = 30 g. ? 1 serving corn x 15 g = 15 g. ? 1 serving milk x 15 g = 15 g. ? 1 serving strawberries x 15 g = 15 g. 4. Add together all of the amounts to find the total grams of carbohydrates eaten: ? 30 g + 15 g + 15 g + 15 g = 75 g of carbohydrates total. This information is not intended to replace advice given to you by your health care provider. Make sure you discuss any questions you have with your health care provider. Document Released: 09/19/2005 Document Revised: 04/08/2016 Document Reviewed: 03/02/2016 Elsevier Interactive Patient Education  2018 [employer].  Hypoglycemia Hypoglycemia is when the sugar (glucose) level in the blood is too low. Symptoms of low blood sugar may include:  Feeling: ? Hungry. ? Worried or nervous (anxious). ? Sweaty and clammy. ? Confused. ? Dizzy. ? Sleepy. ? Sick to your stomach (nauseous).  Having: ? A fast heartbeat. ? A headache. ? A change in your vision. ? Jerky movements that you cannot control (seizure). ? Nightmares. ? Tingling or no feeling (numbness) around the mouth, lips, or tongue.  Having trouble with: ? Talking. ? Paying attention (concentrating). ? Moving (coordination). ? Sleeping.  Shaking.  Passing out (fainting).  Getting upset easily (irritability).  Low blood sugar can happen to people who have diabetes and people who do not have diabetes. Low blood sugar can happen quickly, and it can be an  emergency. Treating Low Blood Sugar Low blood sugar is often treated by eating or drinking something sugary right away. If you can think clearly and swallow safely, follow the 15:15 rule:  Take 15 grams of a fast-acting carb (carbohydrate). Some fast-acting carbs are: ? 1 tube of glucose gel. ? 3 sugar tablets (glucose pills). ? 6-8 pieces of hard candy. ? 4 oz (120 mL) of fruit juice. ? 4 oz (120 mL) of regular (not diet) soda.  Check your blood sugar 15 minutes after you take the carb.  If your blood sugar is still at or below 70 mg/dL (3.9 mmol/L), take 15 grams of a carb again.  If your blood sugar does not go above 70 mg/dL (3.9 mmol/L) after 3 tries, get help right away.  After your blood sugar goes back to normal, eat a meal or a snack within 1 hour.  Treating Very Low Blood Sugar If your  blood sugar is at or below 54 mg/dL (3 mmol/L), you have very low blood sugar (severe hypoglycemia). This is an emergency. Do not wait to see if the symptoms will go away. Get medical help right away. Call your local emergency services (911 in the [place] Do not drive yourself to the hospital. If you have very low blood sugar and you cannot eat or drink, you may need a glucagon shot (injection). A family member or friend should learn how to check your blood sugar and how to give you a glucagon shot. Ask your doctor if you need to have a glucagon shot kit at home. Follow these instructions at home: General instructions  Avoid any diets that cause you to not eat enough food. Talk with your doctor before you start any new diet.  Take over-the-counter and prescription medicines only as told by your doctor.  Limit alcohol to no more than 1 drink per day for nonpregnant women and 2 drinks per day for men. One drink equals 12 oz of beer, 5 oz of wine, or 1 oz of hard liquor.  Keep all follow-up visits as told by your doctor. This is important. If You Have Diabetes:   Make sure you know the  symptoms of low blood sugar.  Always keep a source of sugar with you, such as: ? Sugar. ? Sugar tablets. ? Glucose gel. ? Fruit juice. ? Regular soda (not diet soda). ? Milk. ? Hard candy. ? Honey.  Take your medicines as told.  Follow your exercise and meal plan. ? Eat on time. Do not skip meals. ? Follow your sick day plan when you cannot eat or drink normally. Make this plan ahead of time with your doctor.  Check your blood sugar as often as told by your doctor. Always check before and after exercise.  Share your diabetes care plan with: ? Your work or school. ? People you live with.  Check your pee (urine) for ketones: ? When you are sick. ? As told by your doctor.  Carry a card or wear jewelry that says you have diabetes. If You Have Low Blood Sugar From Other Causes:   Check your blood sugar as often as told by your doctor.  Follow instructions from your doctor about what you cannot eat or drink. Contact a doctor if:  You have trouble keeping your blood sugar in your target range.  You have low blood sugar often. Get help right away if:  You still have symptoms after you eat or drink something sugary.  Your blood sugar is at or below 54 mg/dL (3 mmol/L).  You have jerky movements that you cannot control.  You pass out. These symptoms may be an emergency. Do not wait to see if the symptoms will go away. Get medical help right away. Call your local emergency services (911 in the [place] Do not drive yourself to the hospital. This information is not intended to replace advice given to you by your health care provider. Make sure you discuss any questions you have with your health care provider. Document Released: 12/14/2009 Document Revised: 02/25/2016 Document Reviewed: 10/23/2015 Elsevier Interactive Patient Education  2018 [employer].  Hyperglycemia Hyperglycemia is when the sugar (glucose) level in your blood is too high. It may not cause symptoms. If  you do have symptoms, they may include warning signs, such as:  Feeling more thirsty than normal.  Hunger.  Feeling tired.  Needing to pee (urinate) more than normal.  Blurry eyesight (  vision).  You may get other symptoms as it gets worse, such as:  Dry mouth.  Not being hungry (loss of appetite).  Fruity-smelling breath.  Weakness.  Weight gain or loss that is not planned. Weight loss may be fast.  A tingling or numb feeling in your hands or feet.  Headache.  Skin that does not bounce back quickly when it is lightly pinched and released (poor skin turgor).  Pain in your belly (abdomen).  Cuts or bruises that heal slowly.  High blood sugar can happen to people who do or do not have diabetes. High blood sugar can happen slowly or quickly, and it can be an emergency. Follow these instructions at home: General instructions  Take over-the-counter and prescription medicines only as told by your doctor.  Do not use products that contain nicotine or tobacco, such as cigarettes and e-cigarettes. If you need help quitting, ask your doctor.  Limit alcohol intake to no more than 1 drink per day for nonpregnant women and 2 drinks per day for men. One drink equals 12 oz of beer, 5 oz of wine, or 1 oz of hard liquor.  Manage stress. If you need help with this, ask your doctor.  Keep all follow-up visits as told by your doctor. This is important. Eating and drinking  Stay at a healthy weight.  Exercise regularly, as told by your doctor.  Drink enough fluid, especially when you: ? Exercise. ? Get sick. ? Are in hot temperatures.  Eat healthy foods, such as: ? Low-fat (lean) proteins. ? Complex carbs (complex carbohydrates), such as whole wheat bread or brown rice. ? Fresh fruits and vegetables. ? Low-fat dairy products. ? Healthy fats.  Drink enough fluid to keep your pee (urine) clear or pale yellow. If you have diabetes:  Make sure you know the symptoms of  hyperglycemia.  Follow your diabetes management plan, as told by your doctor. Make sure you: ? Take insulin and medicines as told. ? Follow your exercise plan. ? Follow your meal plan. Eat on time. Do not skip meals. ? Check your blood sugar as often as told. Make sure to check before and after exercise. If you exercise longer or in a different way than you normally do, check your blood sugar more often. ? Follow your sick day plan whenever you cannot eat or drink normally. Make this plan ahead of time with your doctor.  Share your diabetes management plan with people in your workplace, school, and household.  Check your urine for ketones when you are ill and as told by your doctor.  Carry a card or wear jewelry that says that you have diabetes. Contact a doctor if:  Your blood sugar level is higher than 240 mg/dL (13.3 mmol/L) for 2 days in a row.  You have problems keeping your blood sugar in your target range.  High blood sugar happens often for you. Get help right away if:  You have trouble breathing.  You have a change in how you think, feel, or act (mental status).  You feel sick to your stomach (nauseous), and that feeling does not go away.  You cannot stop throwing up (vomiting). These symptoms may be an emergency. Do not wait to see if the symptoms will go away. Get medical help right away. Call your local emergency services (911 in the [place] Do not drive yourself to the hospital. Summary  Hyperglycemia is when the sugar (glucose) level in your blood is too high.  High blood  sugar can happen to people who do or do not have diabetes.  Make sure you drink enough fluids, eat healthy foods, and exercise regularly.  Contact your doctor if you have problems keeping your blood sugar in your target range. This information is not intended to replace advice given to you by your health care provider. Make sure you discuss any questions you have with your health care  provider. Document Released: 07/17/2009 Document Revised: 06/06/2016 Document Reviewed: 06/06/2016 Elsevier Interactive Patient Education  2017 [employer].

## 2017-11-09 ENCOUNTER — Other Ambulatory Visit: Payer: Self-pay | Admitting: Family Medicine

## 2017-11-09 DIAGNOSIS — R7989 Other specified abnormal findings of blood chemistry: Secondary | ICD-10-CM

## 2017-11-09 DIAGNOSIS — E1165 Type 2 diabetes mellitus with hyperglycemia: Secondary | ICD-10-CM

## 2017-11-09 DIAGNOSIS — E785 Hyperlipidemia, unspecified: Secondary | ICD-10-CM

## 2017-11-09 LAB — MICROALBUMIN / CREATININE URINE RATIO
CREATININE, UR: 39.6 mg/dL
Microalb/Creat Ratio: 41.7 mg/g creat — ABNORMAL HIGH (ref 0.0–30.0)
Microalbumin, Urine: 16.5 ug/mL

## 2017-11-09 LAB — LIPID PANEL
CHOL/HDL RATIO: 7.6 ratio — AB (ref 0.0–5.0)
Cholesterol, Total: 221 mg/dL — ABNORMAL HIGH (ref 100–199)
HDL: 29 mg/dL — AB (ref >39)
LDL Calculated: 149 mg/dL — ABNORMAL HIGH (ref 0–99)
TRIGLYCERIDES: 214 mg/dL — AB (ref 0–149)
VLDL Cholesterol Cal: 43 mg/dL — ABNORMAL HIGH (ref 5–40)

## 2017-11-09 LAB — TSH: TSH: 5.67 u[IU]/mL — AB (ref 0.450–4.500)

## 2017-11-09 LAB — HEMOGLOBIN A1C
ESTIMATED AVERAGE GLUCOSE: 309 mg/dL
HEMOGLOBIN A1C: 12.4 % — AB (ref 4.8–5.6)

## 2017-11-09 MED ORDER — ATORVASTATIN CALCIUM 20 MG PO TABS: 20.0000 mg | ORAL_TABLET | Freq: Every day | ORAL | 3 refills | Status: DC

## 2017-11-09 NOTE — Progress Notes (Signed)
Meds ordered this encounter  Medications  . atorvastatin (LIPITOR) 20 MG tablet    Sig: Take 1 tablet (20 mg total) by mouth daily.    Dispense:  90 tablet    Refill:  3    Tommy House Tommy Mander  MSN, FNP-C [facility] [address]  [place],  [address] [phone] 

## 2017-11-10 ENCOUNTER — Telehealth: Payer: Self-pay

## 2017-11-10 MED FILL — ?ATORVASTATIN 20MG TABL: 20 | 30 days supply | Qty: 30 | Fill #0

## 2017-11-10 NOTE — Telephone Encounter (Signed)
Called and spoke with patient, advised that cholesterol and LDL were both above goal. Advised to start atorvastatin 20mg  once daily along with an aspirin 81mg  once daily. Recommended that patient start low fat/low carb diet over 5 to 6 small meals daily, drink 6 to 8 glasses of water daily, and exercise 150 minutes weekly of cardio. Patient verbalized understanding and was asked to keep next scheduled appointment. Thanks!

## 2017-11-10 NOTE — Telephone Encounter (Signed)
-----   Message from Massie MaroonLachina M Hollis, [place] sent at 11/09/2017 11:50 PM EST ----- Regarding: lab results Please inform patient that cholesterol is elevated at 214, goal is < 200 and LDL is 149, goal is < 100. Will start a trial of atorvastatin ( a cholesterol lower drug)20 mg every evening. Also, daily aspirin 81 mg daily. Recommend a lowfat, low carbohydrate diet divided over 5-6 small meals, increase water intake to 6-8 glasses, and 150 minutes per week of cardiovascular exercise.    Thanks

## 2017-11-14 ENCOUNTER — Encounter (HOSPITAL_BASED_OUTPATIENT_CLINIC_OR_DEPARTMENT_OTHER): Payer: Self-pay | Admitting: Emergency Medicine

## 2017-11-14 ENCOUNTER — Other Ambulatory Visit: Payer: Self-pay

## 2017-11-14 ENCOUNTER — Emergency Department (HOSPITAL_BASED_OUTPATIENT_CLINIC_OR_DEPARTMENT_OTHER)
Admission: EM | Admit: 2017-11-14 | Discharge: 2017-11-15 | Disposition: A | Discharge status: Home / Self Care | Payer: Medicaid Other | Attending: Emergency Medicine | Admitting: Emergency Medicine

## 2017-11-14 DIAGNOSIS — R739 Hyperglycemia, unspecified: Secondary | ICD-10-CM

## 2017-11-14 DIAGNOSIS — Z7982 Long term (current) use of aspirin: Secondary | ICD-10-CM | POA: Diagnosis not present

## 2017-11-14 DIAGNOSIS — Z794 Long term (current) use of insulin: Secondary | ICD-10-CM | POA: Insufficient documentation

## 2017-11-14 DIAGNOSIS — Z87891 Personal history of nicotine dependence: Secondary | ICD-10-CM | POA: Insufficient documentation

## 2017-11-14 DIAGNOSIS — E1165 Type 2 diabetes mellitus with hyperglycemia: Secondary | ICD-10-CM | POA: Diagnosis present

## 2017-11-14 LAB — CBG MONITORING, ED: GLUCOSE-CAPILLARY: 394 mg/dL — AB (ref 65–99)

## 2017-11-14 NOTE — ED Triage Notes (Signed)
PT presents with c/o hyperglycemia tonight. Pt did not take insulin tonight. PT is new diabetic since last week and when his sugar registered high tonight he called doc line and was told to come here he was unsure about taking night dose of insulin

## 2017-11-15 ENCOUNTER — Encounter (HOSPITAL_BASED_OUTPATIENT_CLINIC_OR_DEPARTMENT_OTHER): Payer: Self-pay | Admitting: Emergency Medicine

## 2017-11-15 LAB — BASIC METABOLIC PANEL
Anion gap: 9 (ref 5–15)
BUN: 18 mg/dL (ref 6–20)
CALCIUM: 9 mg/dL (ref 8.9–10.3)
CHLORIDE: 98 mmol/L — AB (ref 101–111)
CO2: 24 mmol/L (ref 22–32)
CREATININE: 0.91 mg/dL (ref 0.61–1.24)
GFR calc Af Amer: >60 mL/min (ref >60)
GFR calc non Af Amer: >60 mL/min (ref >60)
GLUCOSE: 353 mg/dL — AB (ref 65–99)
Potassium: 4.1 mmol/L (ref 3.5–5.1)
Sodium: 131 mmol/L — ABNORMAL LOW (ref 135–145)

## 2017-11-15 LAB — CBG MONITORING, ED
Glucose-Capillary: 303 mg/dL — ABNORMAL HIGH (ref 65–99)
Glucose-Capillary: 234 mg/dL — ABNORMAL HIGH (ref 65–99)

## 2017-11-15 MED ORDER — INSULIN REGULAR HUMAN 100 UNIT/ML IJ SOLN
2.0000 [IU] | Freq: Once | INTRAMUSCULAR | Status: AC
  Administered 2017-11-15: 2 [IU] via INTRAVENOUS
  Filled 2017-11-15: qty 1

## 2017-11-15 MED ORDER — SODIUM CHLORIDE 0.9 % IV BOLUS (SEPSIS)
1000.0000 mL | Freq: Once | INTRAVENOUS | Status: AC
Start: 2017-11-15 — End: 2017-11-15
  Administered 2017-11-15: 1000 mL via INTRAVENOUS

## 2017-11-15 NOTE — ED Notes (Signed)
Pt understood dc material. NAD noted. Follow up information covered

## 2017-11-15 NOTE — ED Provider Notes (Signed)
[facility] EMERGENCY DEPARTMENT Provider Note   CSN: [id number] Arrival date & time: 11/14/17  2257     History   Chief Complaint Chief Complaint  Patient presents with  . Hyperglycemia    HPI Tommy House is a 52 y.o. male.  The history is provided by the patient.  Hyperglycemia  Blood sugar level PTA:  354 Severity:  Moderate Onset quality:  Gradual Duration:  1 day Timing:  Constant Progression:  Worsening Chronicity:  Recurrent Diabetes status:  Controlled with insulin and controlled with oral medications Current diabetic therapy:  Metformin and insulin but he forgot to take the insulin Context: new diabetes diagnosis   Relieved by:  Nothing Ineffective treatments:  None tried Associated symptoms: no abdominal pain, no chest pain, no fever, no increased thirst, no nausea, no polyuria, no shortness of breath, no syncope, no vomiting, no weakness and no weight change   Risk factors: obesity     Past Medical History:  Diagnosis Date  . Alcoholism ([place])   . Drug abuse ([place])   . Hypertension   . Stroke [facility])     There are no active problems to display for this patient.   Past Surgical History:  Procedure Laterality Date  . HERNIA REPAIR    . WISDOM TOOTH EXTRACTION         Home Medications    Prior to Admission medications   Medication Sig Start Date End Date Taking? Authorizing Provider  aspirin EC 81 MG tablet Take 1 tablet (81 mg total) by mouth daily. 11/08/17   Dorena Dew, FNP  atorvastatin (LIPITOR) 20 MG tablet Take 1 tablet (20 mg total) by mouth daily. 11/09/17   Dorena Dew, FNP  blood glucose meter kit and supplies KIT Dispense based on patient and insurance preference. Use up to four times daily as directed. (FOR ICD-9 250.00, 250.01). 11/01/17   Sherwood Gambler, MD  ibuprofen (ADVIL,MOTRIN) 200 MG tablet Take 200 mg by mouth every 6 (six) hours as needed.    [provider]  Insulin Glargine (LANTUS SOLOSTAR)  100 UNIT/ML Solostar Pen Inject 20 Units into the skin daily at 10 pm. 11/08/17   Dorena Dew, FNP  Insulin Syringe-Needle U-100 (B-D INS SYRINGE 0.5CC/31GX5/16) 31G X 5/16" 0.5 ML MISC 1 each by Does not apply route 3 times/day as needed-between meals & bedtime. 11/08/17   Dorena Dew, FNP  lamoTRIgine (LAMICTAL) 150 MG tablet Take 300 mg by mouth daily.    [provider]  metFORMIN (GLUCOPHAGE) 1000 MG tablet Take 1 tablet (1,000 mg total) by mouth 2 (two) times daily with a meal. 11/08/17   Dorena Dew, FNP  ofloxacin (OCUFLOX) 0.3 % ophthalmic solution Apply 1 drop every 4 hours to both eyes for the first 2 days, then 1 drop in both eyes 4 times a day for 5 days. Patient not taking: Reported on 11/08/2017 10/05/17   Ok Edwards, PA-C  Polyethyl Glycol-Propyl Glycol (SYSTANE) 0.4-0.3 % GEL ophthalmic gel Place 1 application into both eyes at bedtime as needed. Patient not taking: Reported on 11/08/2017 10/05/17   Ok Edwards, PA-C  Polyethyl Glycol-Propyl Glycol (SYSTANE) 0.4-0.3 % SOLN Apply 1-2 drops to eye 4 (four) times daily as needed. Patient not taking: Reported on 11/08/2017 10/05/17   Arturo Morton    Family History Family History  Problem Relation Age of Onset  . Hypertension Mother   . Diabetes Paternal Aunt     Social History  Social History   Tobacco Use  . Smoking status: Former Research scientist (life sciences)  . Smokeless tobacco: Never Used  Substance Use Topics  . Alcohol use: No    Frequency: Never  . Drug use: No    Comment: Former drug and alcohol use.     Allergies   Patient has no known allergies.   Review of Systems Review of Systems  Constitutional: Negative for fever.  HENT: Negative for congestion.   Respiratory: Negative for shortness of breath.   Cardiovascular: Negative for chest pain, palpitations, leg swelling and syncope.  Gastrointestinal: Negative for abdominal pain, nausea and vomiting.  Endocrine: Negative for cold intolerance, heat intolerance,  polydipsia, polyphagia and polyuria.  Genitourinary: Negative for frequency.  Neurological: Negative for seizures, speech difficulty and weakness.  All other systems reviewed and are negative.    Physical Exam Updated Vital Signs BP 122/87   Pulse 74   Temp 99 F (37.2 C) (Oral)   Resp 20   Ht '5\' 8"'  (1.727 m)   Wt 88.5 kg (195 lb)   SpO2 99%   BMI 29.65 kg/m   Physical Exam  Constitutional: He is oriented to person, place, and time. He appears well-developed and well-nourished. No distress.  HENT:  Head: Normocephalic and atraumatic.  Mouth/Throat: No oropharyngeal exudate.  Eyes: Conjunctivae are normal. Pupils are equal, round, and reactive to light.  Neck: Normal range of motion. Neck supple.  Cardiovascular: Normal rate, regular rhythm, normal heart sounds and intact distal pulses.  Pulmonary/Chest: Effort normal and breath sounds normal. No stridor. No respiratory distress.  Abdominal: Soft. Bowel sounds are normal. He exhibits no mass. There is no tenderness. There is no guarding.  Musculoskeletal: Normal range of motion.  Neurological: He is alert and oriented to person, place, and time. He displays normal reflexes.  Skin: Skin is warm and dry. Capillary refill takes less than 2 seconds.  Psychiatric: He has a normal mood and affect.  Nursing note and vitals reviewed.    ED Treatments / Results  Labs (all labs ordered are listed, but only abnormal results are displayed)  Results for orders placed or performed during the hospital encounter of 54/62/70  Basic metabolic panel  Result Value Ref Range   Sodium 131 (L) 135 - 145 mmol/L   Potassium 4.1 3.5 - 5.1 mmol/L   Chloride 98 (L) 101 - 111 mmol/L   CO2 24 22 - 32 mmol/L   Glucose, Bld 353 (H) 65 - 99 mg/dL   BUN 18 6 - 20 mg/dL   Creatinine, Ser 0.91 0.61 - 1.24 mg/dL   Calcium 9.0 8.9 - 10.3 mg/dL   GFR calc non Af Amer >60 >60 mL/min   GFR calc Af Amer >60 >60 mL/min   Anion gap 9 5 - 15  CBG  monitoring, ED  Result Value Ref Range   Glucose-Capillary 394 (H) 65 - 99 mg/dL  POC CBG, ED  Result Value Ref Range   Glucose-Capillary 303 (H) 65 - 99 mg/dL  CBG monitoring, ED  Result Value Ref Range   Glucose-Capillary 234 (H) 65 - 99 mg/dL   Ct Angio Head W Or Wo Contrast  Result Date: 11/01/2017 CLINICAL DATA:  52 year old male with worst headache of life. Acute severe headache. EXAM: CT ANGIOGRAPHY HEAD AND NECK TECHNIQUE: Multidetector CT imaging of the head and neck was performed using the standard protocol during bolus administration of intravenous contrast. Multiplanar CT image reconstructions and MIPs were obtained to evaluate the vascular anatomy. Carotid stenosis  measurements (when applicable) are obtained utilizing NASCET criteria, using the distal internal carotid diameter as the denominator. CONTRAST:  195m ISOVUE-370 IOPAMIDOL (ISOVUE-370) INJECTION 76% COMPARISON:  None. FINDINGS: CT HEAD Brain: No midline shift, ventriculomegaly, mass effect, evidence of mass lesion, intracranial hemorrhage or evidence of cortically based acute infarction. Gray-white matter differentiation is within normal limits throughout the brain. Calvarium and skull base: No acute osseous abnormality identified. Paranasal sinuses: Mild to moderate bilateral ethmoid and left maxillary sinus mucosal thickening with some bubbly opacity. Frontal sinuses and sphenoid sinuses are well pneumatized. Tympanic cavities and mastoids are clear. Orbits: Visualized orbits and scalp soft tissues are within normal limits. CTA NECK Skeleton: Widespread advanced cervical spine degeneration. Disc space loss with bulky endplate spurring. Widespread facet hypertrophy. Mild degenerative spondylolisthesis at C2-C3 associated with severe facet degeneration on the right including vacuum facet. No acute osseous abnormality identified. Upper chest: Negative visualized superior mediastinum aside from evidence of some calcified left  coronary artery atherosclerosis (series 9, image 2). Negative visible lung parenchyma. Other neck: Symmetric appearing adenoid hypertrophy. Other neck soft tissues are within normal limits. No cervical lymphadenopathy. Aortic arch: Negative aortic arch.  Three vessel arch configuration. Right carotid system: Dense contrast streak artifact from the right subclavian vein mildly limits visualization of the right CCA origin. The right CCA appears normal aside from mild tortuosity. Normal right carotid bifurcation. Normal cervical right ICA. Left carotid system: Mild soft and calcified plaque at the left ICA origin and bulb without stenosis. Otherwise negative. Vertebral arteries: No proximal right subclavian artery stenosis. The right vertebral artery origin appears normal on series 12, image 152. Negative right vertebral artery to the skull base. No proximal left subclavian artery stenosis. The left vertebral artery origin is tortuous with no definite stenosis. The left vertebral artery is patent to the skull base without stenosis. CTA HEAD Posterior circulation: Mild calcified plaque right vertebral artery V4 segment without stenosis. Normal right PICA origin. No distal left vertebral artery stenosis. Patent left PICA origin. Patent vertebrobasilar junction. Patent basilar artery without stenosis. Normal SCA and right PCA origins. Fetal type left PCA origin. Right Posterior communicating artery is diminutive or absent. PCA branches are within normal limits. Anterior circulation: Both ICA siphons are patent without plaque or stenosis. Normal left posterior communicating artery origin. Patent carotid termini. Normal MCA and ACA origins. Mildly tortuous A1 segments. Diminutive or absent anterior communicating artery. Bilateral ACA branches are within normal limits. Left MCA M1 segment, bifurcation, and left MCA branches are within normal limits. Right MCA M1 segment, trifurcation, and right MCA branches are within normal  limits. Venous sinuses: Patent. Anatomic variants: Fetal left PCA origin. Delayed phase: No abnormal enhancement identified. Review of the MIP images confirms the above findings IMPRESSION: 1. No head or neck arterial occlusion, stenosis, dissection, or aneurysm. Mild atherosclerosis at the left ICA origin and right vertebral artery V4 segment. 2. Normal CT appearance of the brain. 3. Severe chronic cervical spine degeneration. 4. Evidence of calcified left coronary artery atherosclerosis Electronically Signed   By: HGenevie AnnM.D.   On: 11/01/2017 12:47   Ct Angio Neck W And/or Wo Contrast  Result Date: 11/01/2017 CLINICAL DATA:  52year old male with worst headache of life. Acute severe headache. EXAM: CT ANGIOGRAPHY HEAD AND NECK TECHNIQUE: Multidetector CT imaging of the head and neck was performed using the standard protocol during bolus administration of intravenous contrast. Multiplanar CT image reconstructions and MIPs were obtained to evaluate the vascular anatomy. Carotid stenosis measurements (when  applicable) are obtained utilizing NASCET criteria, using the distal internal carotid diameter as the denominator. CONTRAST:  18m ISOVUE-370 IOPAMIDOL (ISOVUE-370) INJECTION 76% COMPARISON:  None. FINDINGS: CT HEAD Brain: No midline shift, ventriculomegaly, mass effect, evidence of mass lesion, intracranial hemorrhage or evidence of cortically based acute infarction. Gray-white matter differentiation is within normal limits throughout the brain. Calvarium and skull base: No acute osseous abnormality identified. Paranasal sinuses: Mild to moderate bilateral ethmoid and left maxillary sinus mucosal thickening with some bubbly opacity. Frontal sinuses and sphenoid sinuses are well pneumatized. Tympanic cavities and mastoids are clear. Orbits: Visualized orbits and scalp soft tissues are within normal limits. CTA NECK Skeleton: Widespread advanced cervical spine degeneration. Disc space loss with bulky endplate  spurring. Widespread facet hypertrophy. Mild degenerative spondylolisthesis at C2-C3 associated with severe facet degeneration on the right including vacuum facet. No acute osseous abnormality identified. Upper chest: Negative visualized superior mediastinum aside from evidence of some calcified left coronary artery atherosclerosis (series 9, image 2). Negative visible lung parenchyma. Other neck: Symmetric appearing adenoid hypertrophy. Other neck soft tissues are within normal limits. No cervical lymphadenopathy. Aortic arch: Negative aortic arch.  Three vessel arch configuration. Right carotid system: Dense contrast streak artifact from the right subclavian vein mildly limits visualization of the right CCA origin. The right CCA appears normal aside from mild tortuosity. Normal right carotid bifurcation. Normal cervical right ICA. Left carotid system: Mild soft and calcified plaque at the left ICA origin and bulb without stenosis. Otherwise negative. Vertebral arteries: No proximal right subclavian artery stenosis. The right vertebral artery origin appears normal on series 12, image 152. Negative right vertebral artery to the skull base. No proximal left subclavian artery stenosis. The left vertebral artery origin is tortuous with no definite stenosis. The left vertebral artery is patent to the skull base without stenosis. CTA HEAD Posterior circulation: Mild calcified plaque right vertebral artery V4 segment without stenosis. Normal right PICA origin. No distal left vertebral artery stenosis. Patent left PICA origin. Patent vertebrobasilar junction. Patent basilar artery without stenosis. Normal SCA and right PCA origins. Fetal type left PCA origin. Right Posterior communicating artery is diminutive or absent. PCA branches are within normal limits. Anterior circulation: Both ICA siphons are patent without plaque or stenosis. Normal left posterior communicating artery origin. Patent carotid termini. Normal MCA and  ACA origins. Mildly tortuous A1 segments. Diminutive or absent anterior communicating artery. Bilateral ACA branches are within normal limits. Left MCA M1 segment, bifurcation, and left MCA branches are within normal limits. Right MCA M1 segment, trifurcation, and right MCA branches are within normal limits. Venous sinuses: Patent. Anatomic variants: Fetal left PCA origin. Delayed phase: No abnormal enhancement identified. Review of the MIP images confirms the above findings IMPRESSION: 1. No head or neck arterial occlusion, stenosis, dissection, or aneurysm. Mild atherosclerosis at the left ICA origin and right vertebral artery V4 segment. 2. Normal CT appearance of the brain. 3. Severe chronic cervical spine degeneration. 4. Evidence of calcified left coronary artery atherosclerosis Electronically Signed   By: HGenevie AnnM.D.   On: 11/01/2017 12:47     Radiology No results found.  Procedures Procedures (including critical care time)  Medications Ordered in ED Medications  sodium chloride 0.9 % bolus 1,000 mL (0 mLs Intravenous Stopped 11/15/17 0134)  insulin regular (NOVOLIN R,HUMULIN R) 100 units/mL injection 2 Units (2 Units Intravenous Given 11/15/17 0138)       Final Clinical Impressions(s) / ED Diagnoses   Final diagnoses:  Hyperglycemia  Return for weakness, numbness, changes in vision or speech,  fevers > 100.4 unrelieved by medication, shortness of breath, intractable vomiting, or diarrhea, abdominal pain, Inability to tolerate liquids or food, cough, altered mental status or any concerns. No signs of systemic illness or infection. The patient is nontoxic-appearing on exam and vital signs are within normal limits.    I have reviewed the triage vital signs and the nursing notes. Pertinent labs &imaging results that were available during my care of the patient were reviewed by me and considered in my medical decision making (see chart for details).  After history, exam, and  medical workup I feel the patient has been appropriately medically screened and is safe for discharge home. Pertinent diagnoses were discussed with the patient. Patient was given return precautions.   Laquasia Pincus, MD 11/15/17 0071

## 2017-11-20 ENCOUNTER — Telehealth: Payer: Self-pay

## 2017-11-20 NOTE — Telephone Encounter (Signed)
Called and spoke with patient. He wanted to know of an Opthalmology he could see for a vision exam. I gave him the name and [facility] for [facility]. Thanks!

## 2017-11-22 ENCOUNTER — Telehealth: Payer: Self-pay

## 2017-11-22 ENCOUNTER — Other Ambulatory Visit: Payer: Self-pay | Admitting: Family Medicine

## 2017-11-22 DIAGNOSIS — E119 Type 2 diabetes mellitus without complications: Secondary | ICD-10-CM

## 2017-11-22 DIAGNOSIS — Z794 Long term (current) use of insulin: Principal | ICD-10-CM

## 2017-11-22 NOTE — Telephone Encounter (Signed)
Called, no answer. Voicemail not set up. Thanks!

## 2017-11-22 NOTE — Telephone Encounter (Signed)
Patient wants a referral for Endocrinology for Diabetes. Is this possible, please put in referral. Thanks!

## 2017-12-06 ENCOUNTER — Other Ambulatory Visit: Payer: Self-pay

## 2017-12-06 ENCOUNTER — Encounter: Payer: Self-pay | Admitting: Family Medicine

## 2017-12-06 ENCOUNTER — Ambulatory Visit (INDEPENDENT_AMBULATORY_CARE_PROVIDER_SITE_OTHER): Payer: Medicaid Other | Admitting: Family Medicine

## 2017-12-06 VITALS — BP 128/90 | HR 75 | Temp 98.5°F | Resp 16 | Ht 68.0 in | Wt 197.0 lb

## 2017-12-06 DIAGNOSIS — E119 Type 2 diabetes mellitus without complications: Secondary | ICD-10-CM

## 2017-12-06 DIAGNOSIS — E785 Hyperlipidemia, unspecified: Secondary | ICD-10-CM | POA: Diagnosis not present

## 2017-12-06 LAB — POCT URINALYSIS DIP (DEVICE)
BILIRUBIN URINE: NEGATIVE
Glucose, UA: NEGATIVE mg/dL
Hgb urine dipstick: NEGATIVE
KETONES UR: NEGATIVE mg/dL
LEUKOCYTES UA: NEGATIVE
Nitrite: NEGATIVE
PH: 7 (ref 5.0–8.0)
Protein, ur: NEGATIVE mg/dL
Specific Gravity, Urine: 1.01 (ref 1.005–1.030)
Urobilinogen, UA: 0.2 mg/dL (ref 0.0–1.0)

## 2017-12-06 LAB — POCT GLYCOSYLATED HEMOGLOBIN (HGB A1C): HEMOGLOBIN A1C: 10.6

## 2017-12-06 LAB — POCT CBG (FASTING - GLUCOSE)-MANUAL ENTRY: Glucose Fasting, POC: 163 mg/dL — AB (ref 70–99)

## 2017-12-06 MED ORDER — GLUCOSE BLOOD VI STRP: ORAL_STRIP | 12 refills | Status: DC

## 2017-12-06 MED ORDER — LANCETS MISC: 1.0000 | Freq: Three times a day (TID) | 11 refills | Status: AC

## 2017-12-06 MED FILL — TRUEplus LANCETS 28G MISC: 25 days supply | Qty: 100 | Fill #0

## 2017-12-06 MED FILL — TRUE METRIX TEST STRIP: 30 days supply | Qty: 100 | Fill #0

## 2017-12-06 NOTE — Patient Instructions (Signed)
Hemoglobin A1c has improved to 10.6, will continue Lantus 20 units at bedtime and metformin at thousand milligrams twice daily with meals. Recommend a low-fat, low carbohydrate diet about every 5-6 small meals throughout the day.    Diabetes Mellitus and Nutrition When you have diabetes (diabetes mellitus), it is very important to have healthy eating habits because your blood sugar (glucose) levels are greatly affected by what you eat and drink. Eating healthy foods in the appropriate amounts, at about the same times every day, can help you:  Control your blood glucose.  Lower your risk of heart disease.  Improve your blood pressure.  Reach or maintain a healthy weight.  Every person with diabetes is different, and each person has different needs for a meal plan. Your health care provider may recommend that you work with a diet and nutrition specialist (dietitian) to make a meal plan that is best for you. Your meal plan may vary depending on factors such as:  The calories you need.  The medicines you take.  Your weight.  Your blood glucose, blood pressure, and cholesterol levels.  Your activity level.  Other health conditions you have, such as heart or kidney disease.  How do carbohydrates affect me? Carbohydrates affect your blood glucose level more than any other type of food. Eating carbohydrates naturally increases the amount of glucose in your blood. Carbohydrate counting is a method for keeping track of how many carbohydrates you eat. Counting carbohydrates is important to keep your blood glucose at a healthy level, especially if you use insulin or take certain oral diabetes medicines. It is important to know how many carbohydrates you can safely have in each meal. This is different for every person. Your dietitian can help you calculate how many carbohydrates you should have at each meal and for snack. Foods that contain carbohydrates include:  Bread, cereal, rice, pasta, and  crackers.  Potatoes and corn.  Peas, beans, and lentils.  Milk and yogurt.  Fruit and juice.  Desserts, such as cakes, cookies, ice cream, and candy.  How does alcohol affect me? Alcohol can cause a sudden decrease in blood glucose (hypoglycemia), especially if you use insulin or take certain oral diabetes medicines. Hypoglycemia can be a life-threatening condition. Symptoms of hypoglycemia (sleepiness, dizziness, and confusion) are similar to symptoms of having too much alcohol. If your health care provider says that alcohol is safe for you, follow these guidelines:  Limit alcohol intake to no more than 1 drink per day for nonpregnant women and 2 drinks per day for men. One drink equals 12 oz of beer, 5 oz of wine, or 1 oz of hard liquor.  Do not drink on an empty stomach.  Keep yourself hydrated with water, diet soda, or unsweetened iced tea.  Keep in mind that regular soda, juice, and other mixers may contain a lot of sugar and must be counted as carbohydrates.  What are tips for following this plan? Reading food labels  Start by checking the serving size on the label. The amount of calories, carbohydrates, fats, and other nutrients listed on the label are based on one serving of the food. Many foods contain more than one serving per package.  Check the total grams (g) of carbohydrates in one serving. You can calculate the number of servings of carbohydrates in one serving by dividing the total carbohydrates by 15. For example, if a food has 30 g of total carbohydrates, it would be equal to 2 servings of carbohydrates.  Check the number of grams (g) of saturated and trans fats in one serving. Choose foods that have low or no amount of these fats.  Check the number of milligrams (mg) of sodium in one serving. Most people should limit total sodium intake to less than 2,300 mg per day.  Always check the nutrition information of foods labeled as "low-fat" or "nonfat". These foods  may be higher in added sugar or refined carbohydrates and should be avoided.  Talk to your dietitian to identify your daily goals for nutrients listed on the label. Shopping  Avoid buying canned, premade, or processed foods. These foods tend to be high in fat, sodium, and added sugar.  Shop around the outside edge of the grocery store. This includes fresh fruits and vegetables, bulk grains, fresh meats, and fresh dairy. Cooking  Use low-heat cooking methods, such as baking, instead of high-heat cooking methods like deep frying.  Cook using healthy oils, such as olive, canola, or sunflower oil.  Avoid cooking with butter, cream, or high-fat meats. Meal planning  Eat meals and snacks regularly, preferably at the same times every day. Avoid going long periods of time without eating.  Eat foods high in fiber, such as fresh fruits, vegetables, beans, and whole grains. Talk to your dietitian about how many servings of carbohydrates you can eat at each meal.  Eat 4-6 ounces of lean protein each day, such as lean meat, chicken, fish, eggs, or tofu. 1 ounce is equal to 1 ounce of meat, chicken, or fish, 1 egg, or 1/4 cup of tofu.  Eat some foods each day that contain healthy fats, such as avocado, nuts, seeds, and fish. Lifestyle   Check your blood glucose regularly.  Exercise at least 30 minutes 5 or more days each week, or as told by your health care provider.  Take medicines as told by your health care provider.  Do not use any products that contain nicotine or tobacco, such as cigarettes and e-cigarettes. If you need help quitting, ask your health care provider.  Work with a Veterinary surgeoncounselor or diabetes educator to identify strategies to manage stress and any emotional and social challenges. What are some questions to ask my health care provider?  Do I need to meet with a diabetes educator?  Do I need to meet with a dietitian?  What number can I call if I have questions?  When are the  best times to check my blood glucose? Where to find more information:  American Diabetes Association: diabetes.org/food-and-fitness/food  Academy of Nutrition and Dietetics: https://www.vargas.com/www.eatright.org/resources/health/diseases-and-conditions/diabetes  [employer] of Diabetes and Digestive and Kidney Diseases (NIH): FindJewelers.czwww.niddk.nih.gov/health-information/diabetes/overview/diet-eating-physical-activity Summary  A healthy meal plan will help you control your blood glucose and maintain a healthy lifestyle.  Working with a diet and nutrition specialist (dietitian) can help you make a meal plan that is best for you.  Keep in mind that carbohydrates and alcohol have immediate effects on your blood glucose levels. It is important to count carbohydrates and to use alcohol carefully. This information is not intended to replace advice given to you by your health care provider. Make sure you discuss any questions you have with your health care provider. Document Released: 06/16/2005 Document Revised: 10/24/2016 Document Reviewed: 10/24/2016 Elsevier Interactive Patient Education  [employer]2018 [employer]

## 2017-12-06 NOTE — Progress Notes (Signed)
Subjective:    Patient ID: Tommy InchesSteven Batdorf, male    DOB: 03/17/1966, 52 y.o.   MRN: [id number]  HPI Tommy House, a 52 year old male presents for a one-month follow-up of uncontrolled type 2 diabetes mellitus.  [name] [place] ago to undergo a drug and alcohol rehabilitation program.  He recently moved into a new facility to promote ongoing recovery from alcoholism and drug abuse.  Patient was diagnosed with new onset type 2 diabetes.  His most recent hemoglobin A1c was 12.3.  Patient was started on Lantus 20 units at bedtime and metformin was increased to 1000 mg twice daily.  Patient has also been following a carbohydrate modify diet and has started an exercise routine several times per week.  He endorses periodic blurred vision.  Patient has been evaluated by Dr.Hecker, ophthalmologist, no diabetic retinopathy noted.  He currently denies headache, dizziness, dysuria, polyuria, polydipsia, or polyphagia.  He has been following a carbohydrate modify diet and has been researching type 2 diabetes since initial diagnosis.  Patient states that CBGs have improved throughout the day, he did not bring glucometer to appointment.  Past Medical History:  Diagnosis Date  . Alcoholism (HCC)   . Drug abuse (HCC)   . Hypertension   . Stroke [facility](HCC)    Social History   Socioeconomic History  . Marital status: Single    Spouse name: Not on file  . Number of children: Not on file  . Years of education: Not on file  . Highest education level: Not on file  Social Needs  . Financial resource strain: Not on file  . Food insecurity - worry: Not on file  . Food insecurity - inability: Not on file  . Transportation needs - medical: Not on file  . Transportation needs - non-medical: Not on file  Occupational History  . Not on file  Tobacco Use  . Smoking status: Former Games developermoker  . Smokeless tobacco: Never Used  Substance and Sexual Activity  . Alcohol use: No    Frequency:  Never  . Drug use: No    Comment: Former drug and alcohol use.  Marland Kitchen. Sexual activity: Not on file  Other Topics Concern  . Not on file  Social History Narrative  . Not on file   There is no immunization history on file for this patient.  No Known Allergies Review of Systems  Constitutional: Negative.  Negative for fatigue and fever.  HENT: Negative.   Eyes: Negative.   Respiratory: Negative.   Cardiovascular: Negative.   Gastrointestinal: Negative.   Endocrine: Negative for polydipsia, polyphagia and polyuria.  Genitourinary: Negative.  Negative for decreased urine volume, scrotal swelling, testicular pain and urgency.  Musculoskeletal: Negative.   Skin: Negative.  Negative for rash and wound.  Neurological: Negative for dizziness.  Hematological: Negative.   Psychiatric/Behavioral: Negative.        Objective:   Physical Exam  Constitutional: He is oriented to person, place, and time.  HENT:  Head: Normocephalic and atraumatic.  Right Ear: External ear normal.  Left Ear: External ear normal.  Nose: Nose normal.  Mouth/Throat: Oropharynx is clear and moist.  Eyes: Pupils are equal, round, and reactive to light.  Neck: Normal range of motion. Neck supple.  Cardiovascular: Normal rate, normal heart sounds and intact distal pulses.  Pulmonary/Chest: Effort normal and breath sounds normal.  Abdominal: Soft. Bowel sounds are normal.  Neurological: He is alert and oriented to person, place, and time. He  has normal reflexes.  Skin: Skin is warm and dry.  Psychiatric: He has a normal mood and affect. His behavior is normal. Judgment and thought content normal.     BP 128/90 (BP Location: Left Arm, Patient Position: Sitting, Cuff Size: Large) Comment: manually  Pulse 75   Temp 98.5 F (36.9 C) (Oral)   Resp 16   Ht 5\' 8"  (1.727 m)   Wt 197 lb (89.4 kg)   SpO2 99%   BMI 29.95 kg/m  Assessment & Plan:  1. Type 2 diabetes mellitus without complication, unspecified whether  long term insulin use (HCC) Patient's hemoglobin A1c has improved and is 10.9.  Hemoglobin A1c is above goal.  Goal is less than 7.  Discussed the importance of following up carbohydrate modify diet and exercise plan consistently in order to achieve positive outcomes.  Patient expressed understanding.  Also, discussed daily medication regimen at length. - Glucose (CBG), Fasting - HgB A1c - Lancets MISC; 1 each by Does not apply route 4 (four) times daily -  before meals and at bedtime.  Dispense: 100 each; Refill: 11  2. Hyperlipidemia, unspecified hyperlipidemia type The 10-year ASCVD risk score Denman George DC Montez Hageman., et al., 2013) is: 14.1%   Values used to calculate the score:     Age: 55 years     Sex: Male     Is Non-Hispanic African American: No     Diabetic: Yes     Tobacco smoker: No     Systolic Blood Pressure: 128 mmHg     Is BP treated: No     HDL Cholesterol: 29 mg/dL     Total Cholesterol: 221 mg/dL We will continue statin and ASA therapy.  Will repeat lipid panel in 3 months.    RTC: 3 months for type 2 diabetes mellitus and hyperlipidemia   Nolon Nations  MSN, FNP-C Patient Care [facility] [address] [place], [place] [address] [phone]

## 2017-12-26 MED FILL — metFORMIN HCL 1000 MG TABS: 1000 | 30 days supply | Qty: 60 | Fill #1

## 2017-12-26 MED FILL — ATORVASTATIN 20 MG TABLET: 20 | 30 days supply | Qty: 30 | Fill #1

## 2018-02-02 ENCOUNTER — Ambulatory Visit: Payer: Self-pay | Admitting: Emergency Medicine

## 2018-02-08 ENCOUNTER — Ambulatory Visit: Payer: Self-pay | Admitting: Urgent Care

## 2018-02-09 ENCOUNTER — Ambulatory Visit: Payer: Self-pay | Admitting: Urgent Care

## 2018-02-09 ENCOUNTER — Ambulatory Visit: Payer: Medicaid Other | Admitting: Family Medicine

## 2018-02-21 ENCOUNTER — Encounter: Payer: Self-pay | Admitting: Family Medicine

## 2018-02-21 ENCOUNTER — Ambulatory Visit (INDEPENDENT_AMBULATORY_CARE_PROVIDER_SITE_OTHER): Payer: Medicaid Other | Admitting: Family Medicine

## 2018-02-21 VITALS — BP 140/92 | HR 78 | Temp 99.1°F | Resp 16 | Ht 68.0 in | Wt 198.0 lb

## 2018-02-21 DIAGNOSIS — F32A Depression, unspecified: Secondary | ICD-10-CM

## 2018-02-21 DIAGNOSIS — I10 Essential (primary) hypertension: Secondary | ICD-10-CM

## 2018-02-21 DIAGNOSIS — F419 Anxiety disorder, unspecified: Secondary | ICD-10-CM | POA: Diagnosis not present

## 2018-02-21 DIAGNOSIS — E119 Type 2 diabetes mellitus without complications: Secondary | ICD-10-CM

## 2018-02-21 DIAGNOSIS — E785 Hyperlipidemia, unspecified: Secondary | ICD-10-CM

## 2018-02-21 DIAGNOSIS — R5383 Other fatigue: Secondary | ICD-10-CM | POA: Diagnosis not present

## 2018-02-21 DIAGNOSIS — F329 Major depressive disorder, single episode, unspecified: Secondary | ICD-10-CM | POA: Diagnosis not present

## 2018-02-21 LAB — POCT URINALYSIS DIPSTICK
Bilirubin, UA: NEGATIVE
Blood, UA: NEGATIVE
GLUCOSE UA: POSITIVE — AB
KETONES UA: NEGATIVE
Leukocytes, UA: NEGATIVE
Nitrite, UA: NEGATIVE
Protein, UA: NEGATIVE
SPEC GRAV UA: 1.015 (ref 1.010–1.025)
Urobilinogen, UA: 0.2 E.U./dL
pH, UA: 7 (ref 5.0–8.0)

## 2018-02-21 LAB — POCT GLYCOSYLATED HEMOGLOBIN (HGB A1C): Hemoglobin A1C: 7.9 % — AB (ref 4.0–5.6)

## 2018-02-21 LAB — GLUCOSE, POCT (MANUAL RESULT ENTRY): POC GLUCOSE: 174 mg/dL — AB (ref 70–99)

## 2018-02-21 MED ORDER — METFORMIN HCL 1000 MG PO TABS: 1000.0000 mg | ORAL_TABLET | Freq: Two times a day (BID) | ORAL | 5 refills | Status: DC

## 2018-02-21 MED ORDER — ATORVASTATIN CALCIUM 20 MG PO TABS: 20.0000 mg | ORAL_TABLET | Freq: Every day | ORAL | 3 refills | Status: DC

## 2018-02-21 MED ORDER — LISINOPRIL 10 MG PO TABS: 10.0000 mg | ORAL_TABLET | Freq: Every day | ORAL | 11 refills | Status: DC

## 2018-02-21 MED ORDER — INSULIN GLARGINE 100 UNIT/ML SOLOSTAR PEN: 20.0000 [IU] | PEN_INJECTOR | Freq: Every day | SUBCUTANEOUS | 11 refills | Status: DC

## 2018-02-21 NOTE — Patient Instructions (Signed)
We will follow-up by phone with any abnormal laboratory results. Your hemoglobin A1c is 7.9 which is improved from 12.43 months ago.  Will continue Lantus 20 units at bedtime and metformin 1000 mg twice daily.  Will recheck A1c in 3 months.  Hemoglobin A1c goal is less than 7.  Blood sugar upon awakening should range between 110-140.  Also, blood sugar 2 to 3 hours following a meal should range between 140-180.   Continue to check CBGs at home and bring glucometer to follow-up appointment. We will also start a trial of lisinopril 10 mg daily for essential hypertension.  Blood pressure goal is less than 140/90.  Return to clinic in 1 week for blood pressure check.  - Continue medication, monitor blood pressure at home. Continue DASH diet. Reminder to go to the ER if any CP, SOB, nausea, dizziness, severe HA, changes vision/speech, left arm numbness and tingling and jaw pain.

## 2018-02-21 NOTE — Progress Notes (Signed)
Subjective:     Tommy House is a 52 y.o. male with a history of uncontrolled type 2 diabetes mellitus presents for follow-up of chronic conditions.  Patient is also complaining of fatigue that began 2-1/2 months ago.  Patient recently returned from a mission trip to [place].  He was there for approximately 3 weeks.  Symptoms of fatigue began with general malaise and diffuse body aches and pain.  He denies intolerance to heat or cold, constipation, or rectal bleeding.  His current psychological symptoms include depression and anxiety.  Patient has a history of major depression disorder.  He is followed by [facility]	[place].  He states that he discontinue Lamictal while on a mission trip and restarted medication 1 week ago.  Symptoms of depression include anhedonia, hopelessness, and fatigue.  He currently denies suicidal or homicidal ideations.  Past Medical History:  Diagnosis Date  . Alcoholism (HCC)   . Drug abuse (HCC)   . Hypertension   . Stroke [facility])    Social History   Socioeconomic History  . Marital status: Single    Spouse name: Not on file  . Number of children: Not on file  . Years of education: Not on file  . Highest education level: Not on file  Occupational History  . Not on file  Social Needs  . Financial resource strain: Not on file  . Food insecurity:    Worry: Not on file    Inability: Not on file  . Transportation needs:    Medical: Not on file    Non-medical: Not on file  Tobacco Use  . Smoking status: Former Games developer  . Smokeless tobacco: Never Used  Substance and Sexual Activity  . Alcohol use: No    Frequency: Never  . Drug use: No    Comment: Former drug and alcohol use.  Marland Kitchen Sexual activity: Not on file  Lifestyle  . Physical activity:    Days per week: Not on file    Minutes per session: Not on file  . Stress: Not on file  Relationships  . Social connections:    Talks on phone: Not on file    Gets together: Not on file    Attends  religious service: Not on file    Active member of club or organization: Not on file    Attends meetings of clubs or organizations: Not on file    Relationship status: Not on file  . Intimate partner violence:    Fear of current or ex partner: Not on file    Emotionally abused: Not on file    Physically abused: Not on file    Forced sexual activity: Not on file  Other Topics Concern  . Not on file  Social History Narrative  . Not on file  Review of Systems  Constitutional: Positive for malaise/fatigue. Negative for fever.  HENT: Negative.   Eyes: Negative.  Negative for double vision, photophobia, pain, discharge and redness.  Respiratory: Negative.   Cardiovascular: Negative.  Negative for palpitations, orthopnea, claudication and leg swelling.  Gastrointestinal: Negative for constipation, diarrhea and nausea.  Genitourinary: Negative.  Negative for frequency and hematuria.  Musculoskeletal: Negative.   Skin: Negative.   Psychiatric/Behavioral: Positive for depression. Negative for memory loss, substance abuse and suicidal ideas. The patient is nervous/anxious.     Objective:  Physical Exam  Constitutional: He is oriented to person, place, and time. He appears well-developed and well-nourished.  HENT:  Head: Normocephalic.  Eyes: Pupils are equal, round,  and reactive to light.  Neck: Normal range of motion.  Cardiovascular: Normal rate, regular rhythm, normal heart sounds and intact distal pulses.  Pulmonary/Chest: Effort normal and breath sounds normal.  Abdominal: Soft. Bowel sounds are normal.  Neurological: He is alert and oriented to person, place, and time.  Skin: Skin is warm and dry.  Psychiatric: He has a normal mood and affect. His behavior is normal. Judgment and thought content normal.     Assessment:   BP (!) 140/92 (BP Location: Right Arm, Patient Position: Sitting, Cuff Size: Normal) Comment: manually  Pulse 78   Temp 99.1 F (37.3 C) (Oral)   Resp 16    Ht  (1.727 m)   Wt 198 lb (89.8 kg)   SpO2 98%   BMI 30.11 kg/m  Plan:  1. Type 2 diabetes mellitus without complication, unspecified whether long term insulin use (HCC) Hemoglobin A1c has improved to 7.9, will continue Lantus 20 units at bedtime and metformin 1000 mg twice daily.  Also recommend a carbohydrate modify diet divided over 5-6 small meals throughout the day. - Urinalysis Dipstick - HgB A1c - Glucose (CBG) - Insulin Glargine (LANTUS SOLOSTAR) 100 UNIT/ML Solostar Pen; Inject 20 Units into the skin daily at 10 pm.  Dispense: 5 pen; Refill: 11 - metFORMIN (GLUCOPHAGE) 1000 MG tablet; Take 1 tablet (1,000 mg total) by mouth 2 (two) times daily with a meal.  Dispense: 60 tablet; Refill: 5  2. Anxiety and depression Recommend that patient continues to follow-up with [facility] for continuous depression and anxiety.  Also discussed the importance of taking medications consistently in order to achieve positive outcomes. GAD 7 : Generalized Anxiety Score 02/21/2018  Nervous, Anxious, on Edge 2  Control/stop worrying 2  Worry too much - different things 2  Trouble relaxing 3  Restless 2  Easily annoyed or irritable 0  Afraid - awful might happen 0  Total GAD 7 Score 11    3. Fatigue, unspecified type - Comprehensive metabolic panel - CBC with Differential - Thyroid Panel With TSH  4. Hyperlipidemia, unspecified hyperlipidemia type The 10-year ASCVD risk score Denman George DC Jr., et al., 2013) is: 18.9%   Values used to calculate the score:     Age: 70 years     Sex: Male     Is Non-Hispanic African American: No     Diabetic: Yes     Tobacco smoker: No     Systolic Blood Pressure: 140 mmHg     Is BP treated: Yes     HDL Cholesterol: 29 mg/dL     Total Cholesterol: 221 mg/dL  - atorvastatin (LIPITOR) 20 MG tablet; Take 1 tablet (20 mg total) by mouth daily.  Dispense: 90 tablet; Refill: 3  5. Essential hypertension Blood pressure is above goal on current  medication regimen. Will increase Lisinopril to 10 mg daily.  - lisinopril (PRINIVIL,ZESTRIL) 10 MG tablet; Take 1 tablet (10 mg total) by mouth daily.  Dispense: 30 tablet; Refill: 11   RTC: 3 months for hypertension   Nolon Nations  MSN, FNP-C Patient Care [facility] [address] [place], [place] [address] [phone]

## 2018-02-22 LAB — CBC WITH DIFFERENTIAL/PLATELET
BASOS ABS: 0 10*3/uL (ref 0.0–0.2)
Basos: 1 %
EOS (ABSOLUTE): 0.3 10*3/uL (ref 0.0–0.4)
Eos: 4 %
HEMOGLOBIN: 15.6 g/dL (ref 13.0–17.7)
Hematocrit: 45.7 % (ref 37.5–51.0)
IMMATURE GRANS (ABS): 0 10*3/uL (ref 0.0–0.1)
Immature Granulocytes: 0 %
Lymphocytes Absolute: 2.8 10*3/uL (ref 0.7–3.1)
Lymphs: 45 %
MCH: 28.5 pg (ref 26.6–33.0)
MCHC: 34.1 g/dL (ref 31.5–35.7)
MCV: 83 fL (ref 79–97)
MONOCYTES: 9 %
Monocytes Absolute: 0.6 10*3/uL (ref 0.1–0.9)
Neutrophils Absolute: 2.5 10*3/uL (ref 1.4–7.0)
Neutrophils: 41 %
PLATELETS: 281 10*3/uL (ref 150–450)
RBC: 5.48 x10E6/uL (ref 4.14–5.80)
RDW: 14.1 % (ref 12.3–15.4)
WBC: 6.2 10*3/uL (ref 3.4–10.8)

## 2018-02-22 LAB — COMPREHENSIVE METABOLIC PANEL
ALK PHOS: 103 IU/L (ref 39–117)
ALT: 39 IU/L (ref 0–44)
AST: 17 IU/L (ref 0–40)
Albumin/Globulin Ratio: 2.4 — ABNORMAL HIGH (ref 1.2–2.2)
Albumin: 4.7 g/dL (ref 3.5–5.5)
BILIRUBIN TOTAL: 1.1 mg/dL (ref 0.0–1.2)
BUN/Creatinine Ratio: 11 (ref 9–20)
BUN: 10 mg/dL (ref 6–24)
CHLORIDE: 98 mmol/L (ref 96–106)
CO2: 22 mmol/L (ref 20–29)
Calcium: 9.3 mg/dL (ref 8.7–10.2)
Creatinine, Ser: 0.93 mg/dL (ref 0.76–1.27)
GFR calc Af Amer: 109 mL/min/{1.73_m2} (ref >59)
GFR calc non Af Amer: 95 mL/min/{1.73_m2} (ref >59)
GLUCOSE: 147 mg/dL — AB (ref 65–99)
Globulin, Total: 2 g/dL (ref 1.5–4.5)
Potassium: 4.5 mmol/L (ref 3.5–5.2)
Sodium: 137 mmol/L (ref 134–144)
Total Protein: 6.7 g/dL (ref 6.0–8.5)

## 2018-02-22 LAB — THYROID PANEL WITH TSH
FREE THYROXINE INDEX: 1.6 (ref 1.2–4.9)
T3 Uptake Ratio: 25 % (ref 24–39)
T4 TOTAL: 6.3 ug/dL (ref 4.5–12.0)
TSH: 3.83 u[IU]/mL (ref 0.450–4.500)

## 2018-03-16 ENCOUNTER — Other Ambulatory Visit: Payer: Self-pay

## 2018-03-16 ENCOUNTER — Telehealth: Payer: Self-pay

## 2018-03-16 DIAGNOSIS — E785 Hyperlipidemia, unspecified: Secondary | ICD-10-CM

## 2018-03-16 DIAGNOSIS — E119 Type 2 diabetes mellitus without complications: Secondary | ICD-10-CM

## 2018-03-16 MED ORDER — GLUCOSE BLOOD VI STRP: ORAL_STRIP | 12 refills | Status: DC

## 2018-03-16 MED ORDER — METFORMIN HCL 1000 MG PO TABS: 1000.0000 mg | ORAL_TABLET | Freq: Two times a day (BID) | ORAL | 5 refills | Status: DC

## 2018-03-16 MED ORDER — ACCU-CHEK AVIVA PLUS W/DEVICE KIT: 1.0000 | PACK | Freq: Three times a day (TID) | 0 refills | Status: AC

## 2018-03-16 MED ORDER — ATORVASTATIN CALCIUM 20 MG PO TABS: 20.0000 mg | ORAL_TABLET | Freq: Every day | ORAL | 3 refills | Status: DC

## 2018-03-16 NOTE — Telephone Encounter (Signed)
Refills sent in

## 2018-03-20 ENCOUNTER — Other Ambulatory Visit: Payer: Self-pay

## 2018-03-20 DIAGNOSIS — E785 Hyperlipidemia, unspecified: Secondary | ICD-10-CM

## 2018-03-20 DIAGNOSIS — E119 Type 2 diabetes mellitus without complications: Secondary | ICD-10-CM

## 2018-03-20 MED ORDER — GLUCOSE BLOOD VI STRP: ORAL_STRIP | 12 refills | Status: AC

## 2018-03-20 MED ORDER — METFORMIN HCL 1000 MG PO TABS
1000.0000 mg | ORAL_TABLET | Freq: Two times a day (BID) | ORAL | 5 refills | Status: DC
Start: 2018-03-20 — End: 2018-07-27

## 2018-03-20 MED ORDER — ATORVASTATIN CALCIUM 20 MG PO TABS
20.0000 mg | ORAL_TABLET | Freq: Every day | ORAL | 3 refills | Status: DC
Start: 2018-03-20 — End: 2018-07-27

## 2018-03-26 ENCOUNTER — Other Ambulatory Visit: Payer: Self-pay

## 2018-03-26 MED ORDER — ACCU-CHEK AVIVA DEVI: 0 refills | Status: DC

## 2018-04-03 ENCOUNTER — Telehealth: Payer: Self-pay

## 2018-04-03 NOTE — Telephone Encounter (Signed)
Patient called and is asking if you can put in some lab orders for him. He is trying to move to another "half-way house" and they need to be tested for HIV, Hep C, and TB. Can we order these without a visit? He has to leave where he currently is by July 21st and the first available appointment that can be made is July 26th. Please advise. Thanks!

## 2018-04-04 ENCOUNTER — Other Ambulatory Visit: Payer: Self-pay | Admitting: Family Medicine

## 2018-04-04 DIAGNOSIS — Z1159 Encounter for screening for other viral diseases: Secondary | ICD-10-CM

## 2018-04-04 DIAGNOSIS — Z111 Encounter for screening for respiratory tuberculosis: Secondary | ICD-10-CM

## 2018-04-04 DIAGNOSIS — Z114 Encounter for screening for human immunodeficiency virus [HIV]: Secondary | ICD-10-CM

## 2018-04-04 NOTE — Progress Notes (Signed)
Orders Placed This Encounter  Procedures  . HIV antibody (with reflex)    Standing Status:   Future    Standing Expiration Date:   04/05/2019  . Hepatitis C Antibody    Standing Status:   Future    Standing Expiration Date:   04/05/2019  . QuantiFERON-TB Gold Plus    Standing Status:   Future    Standing Expiration Date:   04/05/2019     Nolon NationsLachina Moore Bryton Waight  MSN, FNP-C Patient Care [facility] [address]  [place], [place] [id number] [phone]

## 2018-04-09 ENCOUNTER — Other Ambulatory Visit: Payer: Medicaid Other

## 2018-04-09 DIAGNOSIS — Z114 Encounter for screening for human immunodeficiency virus [HIV]: Secondary | ICD-10-CM

## 2018-04-09 DIAGNOSIS — Z1159 Encounter for screening for other viral diseases: Secondary | ICD-10-CM

## 2018-04-09 DIAGNOSIS — Z111 Encounter for screening for respiratory tuberculosis: Secondary | ICD-10-CM

## 2018-04-12 ENCOUNTER — Ambulatory Visit: Payer: Medicaid Other | Admitting: Family Medicine

## 2018-04-13 ENCOUNTER — Other Ambulatory Visit: Payer: Self-pay | Admitting: Family Medicine

## 2018-04-13 ENCOUNTER — Ambulatory Visit (HOSPITAL_COMMUNITY)
Admission: RE | Admit: 2018-04-13 | Discharge: 2018-04-13 | Disposition: A | Discharge status: Home / Self Care | Payer: Medicaid Other | Source: Ambulatory Visit | Attending: Family Medicine | Admitting: Family Medicine

## 2018-04-13 ENCOUNTER — Encounter: Payer: Self-pay | Admitting: Family Medicine

## 2018-04-13 ENCOUNTER — Ambulatory Visit (INDEPENDENT_AMBULATORY_CARE_PROVIDER_SITE_OTHER): Payer: Medicaid Other | Admitting: Family Medicine

## 2018-04-13 VITALS — BP 144/96 | HR 81 | Temp 98.3°F | Resp 16 | Ht 68.0 in | Wt 204.0 lb

## 2018-04-13 DIAGNOSIS — Z111 Encounter for screening for respiratory tuberculosis: Secondary | ICD-10-CM

## 2018-04-13 DIAGNOSIS — R7612 Nonspecific reaction to cell mediated immunity measurement of gamma interferon antigen response without active tuberculosis: Secondary | ICD-10-CM | POA: Diagnosis not present

## 2018-04-13 DIAGNOSIS — Z794 Long term (current) use of insulin: Secondary | ICD-10-CM | POA: Diagnosis not present

## 2018-04-13 DIAGNOSIS — R5383 Other fatigue: Secondary | ICD-10-CM | POA: Diagnosis not present

## 2018-04-13 DIAGNOSIS — E119 Type 2 diabetes mellitus without complications: Secondary | ICD-10-CM | POA: Diagnosis not present

## 2018-04-13 LAB — POCT GLYCOSYLATED HEMOGLOBIN (HGB A1C): Hemoglobin A1C: 7.1 % — AB (ref 4.0–5.6)

## 2018-04-13 LAB — QUANTIFERON-TB GOLD PLUS
QUANTIFERON NIL VALUE: 0.2 [IU]/mL
QUANTIFERON TB2 AG VALUE: 0.54 [IU]/mL
QuantiFERON Mitogen Value: >10 IU/mL
QuantiFERON TB1 Ag Value: 1.07 IU/mL
QuantiFERON-TB Gold Plus: POSITIVE — AB

## 2018-04-13 LAB — HEPATITIS C ANTIBODY

## 2018-04-13 LAB — HIV ANTIBODY (ROUTINE TESTING W REFLEX): HIV SCREEN 4TH GENERATION: NONREACTIVE

## 2018-04-13 NOTE — Progress Notes (Signed)
Subjective   Tommy InchesSteven Forquer 52 y.o. male  [id number]  [id number]  08/08/1966    Chief Complaint  Patient presents with  . Fatigue  . Weight Gain    Patient presents with complaints of fatigue and weight gain for the past several weeks. He states that he had an elevated TSH and would like it repeated today.   He states that he has been noncompliant with a diabetic diet or low-sodium diet.  Patient states that he has a history of a positive tuberculosis skin test.  Needs QuantiFERON gold results today.  Results were positive today and he will require a chest x-ray.  Patient denies chest pain shortness of breath dizziness or leg swelling.  Patient with a history of substance abuse that is now in remission.  He is staying in a transitional facility.  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   Review of Systems  Constitutional: Positive for malaise/fatigue.       Weight gain  Eyes: Negative.   Respiratory: Negative.   Cardiovascular: Negative.   Musculoskeletal: Negative.   Neurological: Negative.   Psychiatric/Behavioral: Negative.     Objective   Physical Exam  Constitutional: He is oriented to person, place, and time. He appears well-developed and well-nourished.  HENT:  Head: Normocephalic and atraumatic.  Right Ear: External ear normal.  Left Ear: External ear normal.  Nose: Nose normal.  Mouth/Throat: Oropharynx is clear and moist.  Eyes: Pupils are equal, round, and reactive to light. Conjunctivae and EOM are normal.  Neck: Normal range of motion. Neck supple. No tracheal deviation present. No thyromegaly present.  Cardiovascular: Normal rate, regular rhythm, normal heart sounds and intact distal pulses. Exam reveals no friction rub.  No murmur heard. Pulmonary/Chest: Effort normal and breath sounds normal. No stridor. No respiratory distress. He has no wheezes.  Abdominal: Soft. Bowel sounds are normal. He exhibits  no distension and no mass. There is no tenderness.  Musculoskeletal: Normal range of motion.  Lymphadenopathy:    He has no cervical adenopathy.  Neurological: He is alert and oriented to person, place, and time.  Skin: Skin is warm and dry.  Psychiatric: He has a normal mood and affect. His behavior is normal. Judgment and thought content normal.  Nursing note and vitals reviewed.   BP (!) 144/96 Comment: manually  Pulse 81   Temp 98.3 F (36.8 C) (Oral)   Resp 16   Ht 5\' 8"  (1.727 m)   Wt 204 lb (92.5 kg)   SpO2 98%   BMI 31.02 kg/m   Assessment   Encounter Diagnoses  Name Primary?  . Fatigue, unspecified type Yes  . Positive QuantiFERON-TB Gold test   . Type 2 diabetes mellitus without complication, with long-term current use of insulin (HCC)      Plan  1. Fatigue, unspecified type - Thyroid Panel With TSH- last TSH normal. 5.6 in 10/2017 - CHEST X-RAY 1 VIEW (71010) - CBC with Differential - Comprehensive metabolic panel  2. Positive QuantiFERON-TB Gold test Chest x ray ordered today. 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 Pt Did not continue with treatment.   3. Type 2 diabetes mellitus without complication, with long-term current use of insulin (HCC) A1C 1 month prior: 7.9 - Thyroid Panel With TSH - CBC with Differential - Comprehensive metabolic panel    This note has been created with Education officer, environmentalDragon speech recognition software and smart phrase technology. Any  transcriptional errors are unintentional.

## 2018-04-13 NOTE — Patient Instructions (Signed)
Insomnia Insomnia is a sleep disorder that makes it difficult to fall asleep or to stay asleep. Insomnia can cause tiredness (fatigue), low energy, difficulty concentrating, mood swings, and poor performance at work or school. There are three different ways to classify insomnia:  Difficulty falling asleep.  Difficulty staying asleep.  Waking up too early in the morning.  Any type of insomnia can be long-term (chronic) or short-term (acute). Both are common. Short-term insomnia usually lasts for three months or less. Chronic insomnia occurs at least three times a week for longer than three months. What are the causes? Insomnia may be caused by another condition, situation, or substance, such as:  Anxiety.  Certain medicines.  Gastroesophageal reflux disease (GERD) or other gastrointestinal conditions.  Asthma or other breathing conditions.  Restless legs syndrome, sleep apnea, or other sleep disorders.  Chronic pain.  Menopause. This may include hot flashes.  Stroke.  Abuse of alcohol, tobacco, or illegal drugs.  Depression.  Caffeine.  Neurological disorders, such as Alzheimer disease.  An overactive thyroid (hyperthyroidism).  The cause of insomnia may not be known. What increases the risk? Risk factors for insomnia include:  Gender. Women are more commonly affected than men.  Age. Insomnia is more common as you get older.  Stress. This may involve your professional or personal life.  Income. Insomnia is more common in people with lower income.  Lack of exercise.  Irregular work schedule or night shifts.  Traveling between different time zones.  What are the signs or symptoms? If you have insomnia, trouble falling asleep or trouble staying asleep is the main symptom. This may lead to other symptoms, such as:  Feeling fatigued.  Feeling nervous about going to sleep.  Not feeling rested in the morning.  Having trouble concentrating.  Feeling  irritable, anxious, or depressed.  How is this treated? Treatment for insomnia depends on the cause. If your insomnia is caused by an underlying condition, treatment will focus on addressing the condition. Treatment may also include:  Medicines to help you sleep.  Counseling or therapy.  Lifestyle adjustments.  Follow these instructions at home:  Take medicines only as directed by your health care provider.  Keep regular sleeping and waking hours. Avoid naps.  Keep a sleep diary to help you and your health care provider figure out what could be causing your insomnia. Include: ? When you sleep. ? When you wake up during the night. ? How well you sleep. ? How rested you feel the next day. ? Any side effects of medicines you are taking. ? What you eat and drink.  Make your bedroom a comfortable place where it is easy to fall asleep: ? Put up shades or special blackout curtains to block light from outside. ? Use a white noise machine to block noise. ? Keep the temperature cool.  Exercise regularly as directed by your health care provider. Avoid exercising right before bedtime.  Use relaxation techniques to manage stress. Ask your health care provider to suggest some techniques that may work well for you. These may include: ? Breathing exercises. ? Routines to release muscle tension. ? Visualizing peaceful scenes.  Cut back on alcohol, caffeinated beverages, and cigarettes, especially close to bedtime. These can disrupt your sleep.  Do not overeat or eat spicy foods right before bedtime. This can lead to digestive discomfort that can make it hard for you to sleep.  Limit screen use before bedtime. This includes: ? Watching TV. ? Using your smartphone, tablet, and   computer.  Stick to a routine. This can help you fall asleep faster. Try to do a quiet activity, brush your teeth, and go to bed at the same time each night.  Get out of bed if you are still awake after 15 minutes  of trying to sleep. Keep the lights down, but try reading or doing a quiet activity. When you feel sleepy, go back to bed.  Make sure that you drive carefully. Avoid driving if you feel very sleepy.  Keep all follow-up appointments as directed by your health care provider. This is important. Contact a health care provider if:  You are tired throughout the day or have trouble in your daily routine due to sleepiness.  You continue to have sleep problems or your sleep problems get worse. Get help right away if:  You have serious thoughts about hurting yourself or someone else. This information is not intended to replace advice given to you by your health care provider. Make sure you discuss any questions you have with your health care provider. Document Released: 09/16/2000 Document Revised: 02/19/2016 Document Reviewed: 06/20/2014 Elsevier Interactive Patient Education  2018 [employer] Fatigue Fatigue is feeling tired all of the time, a lack of energy, or a lack of motivation. Occasional or mild fatigue is often a normal response to activity or life in general. However, long-lasting (chronic) or extreme fatigue may indicate an underlying medical condition. Follow these instructions at home: Watch your fatigue for any changes. The following actions may help to lessen any discomfort you are feeling:  Talk to your health care provider about how much sleep you need each night. Try to get the required amount every night.  Take medicines only as directed by your health care provider.  Eat a healthy and nutritious diet. Ask your health care provider if you need help changing your diet.  Drink enough fluid to keep your urine clear or pale yellow.  Practice ways of relaxing, such as yoga, meditation, massage therapy, or acupuncture.  Exercise regularly.  Change situations that cause you stress. Try to keep your work and personal routine reasonable.  Do not abuse illegal drugs.  Limit  alcohol intake to no more than 1 drink per day for nonpregnant women and 2 drinks per day for men. One drink equals 12 ounces of beer, 5 ounces of wine, or 1 ounces of hard liquor.  Take a multivitamin, if directed by your health care provider.  Contact a health care provider if:  Your fatigue does not get better.  You have a fever.  You have unintentional weight loss or gain.  You have headaches.  You have difficulty: ? Falling asleep. ? Sleeping throughout the night.  You feel angry, guilty, anxious, or sad.  You are unable to have a bowel movement (constipation).  You skin is dry.  Your legs or another part of your body is swollen. Get help right away if:  You feel confused.  Your vision is blurry.  You feel faint or pass out.  You have a severe headache.  You have severe abdominal, pelvic, or back pain.  You have chest pain, shortness of breath, or an irregular or fast heartbeat.  You are unable to urinate or you urinate less than normal.  You develop abnormal bleeding, such as bleeding from the rectum, vagina, nose, lungs, or nipples.  You vomit blood.  You have thoughts about harming yourself or committing suicide.  You are worried that you might harm someone else. This information  is not intended to replace advice given to you by your health care provider. Make sure you discuss any questions you have with your health care provider. Document Released: 07/17/2007 Document Revised: 02/25/2016 Document Reviewed: 01/21/2014 Elsevier Interactive Patient Education  [employer].

## 2018-04-14 LAB — COMPREHENSIVE METABOLIC PANEL
ALT: 50 IU/L — ABNORMAL HIGH (ref 0–44)
AST: 23 IU/L (ref 0–40)
Albumin/Globulin Ratio: 2.6 — ABNORMAL HIGH (ref 1.2–2.2)
Albumin: 5 g/dL (ref 3.5–5.5)
Alkaline Phosphatase: 111 IU/L (ref 39–117)
BUN/Creatinine Ratio: 13 (ref 9–20)
BUN: 11 mg/dL (ref 6–24)
Bilirubin Total: 1.1 mg/dL (ref 0.0–1.2)
CO2: 23 mmol/L (ref 20–29)
Calcium: 9.6 mg/dL (ref 8.7–10.2)
Chloride: 101 mmol/L (ref 96–106)
Creatinine, Ser: 0.88 mg/dL (ref 0.76–1.27)
GFR calc Af Amer: 115 mL/min/{1.73_m2} (ref >59)
GFR calc non Af Amer: 99 mL/min/{1.73_m2} (ref >59)
Globulin, Total: 1.9 g/dL (ref 1.5–4.5)
Glucose: 146 mg/dL — ABNORMAL HIGH (ref 65–99)
Potassium: 4.8 mmol/L (ref 3.5–5.2)
Sodium: 141 mmol/L (ref 134–144)
Total Protein: 6.9 g/dL (ref 6.0–8.5)

## 2018-04-14 LAB — CBC WITH DIFFERENTIAL/PLATELET
Basophils Absolute: 0 10*3/uL (ref 0.0–0.2)
Basos: 1 %
EOS (ABSOLUTE): 0.1 10*3/uL (ref 0.0–0.4)
Eos: 2 %
Hematocrit: 45.6 % (ref 37.5–51.0)
Hemoglobin: 15.6 g/dL (ref 13.0–17.7)
Immature Grans (Abs): 0 10*3/uL (ref 0.0–0.1)
Immature Granulocytes: 0 %
Lymphocytes Absolute: 2.5 10*3/uL (ref 0.7–3.1)
Lymphs: 36 %
MCH: 28.6 pg (ref 26.6–33.0)
MCHC: 34.2 g/dL (ref 31.5–35.7)
MCV: 84 fL (ref 79–97)
Monocytes Absolute: 0.6 10*3/uL (ref 0.1–0.9)
Monocytes: 9 %
Neutrophils Absolute: 3.8 10*3/uL (ref 1.4–7.0)
Neutrophils: 52 %
Platelets: 282 10*3/uL (ref 150–450)
RBC: 5.46 x10E6/uL (ref 4.14–5.80)
RDW: 14.5 % (ref 12.3–15.4)
WBC: 7.1 10*3/uL (ref 3.4–10.8)

## 2018-04-14 LAB — THYROID PANEL WITH TSH
Free Thyroxine Index: 1.6 (ref 1.2–4.9)
T3 Uptake Ratio: 26 % (ref 24–39)
T4, Total: 6.3 ug/dL (ref 4.5–12.0)
TSH: 4.99 u[IU]/mL — ABNORMAL HIGH (ref 0.450–4.500)

## 2018-04-16 ENCOUNTER — Encounter: Payer: Self-pay | Admitting: Endocrinology

## 2018-04-16 ENCOUNTER — Ambulatory Visit (INDEPENDENT_AMBULATORY_CARE_PROVIDER_SITE_OTHER): Payer: Medicaid Other | Admitting: Endocrinology

## 2018-04-16 DIAGNOSIS — E119 Type 2 diabetes mellitus without complications: Secondary | ICD-10-CM

## 2018-04-16 MED ORDER — SITAGLIPTIN PHOSPHATE 100 MG PO TABS: 100.0000 mg | ORAL_TABLET | Freq: Every day | ORAL | 3 refills | Status: DC

## 2018-04-16 MED ORDER — INSULIN GLARGINE 100 UNIT/ML SOLOSTAR PEN: 10.0000 [IU] | PEN_INJECTOR | SUBCUTANEOUS | 11 refills | Status: DC

## 2018-04-16 NOTE — Progress Notes (Signed)
Subjective:    Patient ID: Tommy House, male    DOB: Jun 25, 1966, 52 y.o.   MRN: [id number]  HPI pt is referred by Lanae Boast, NP, for diabetes.  Pt states DM was dx'ed in 2016; he has mild if any neuropathy of the lower extremities; he is unaware of any associated chronic complications; he has been on insulin since early 2019; pt says his diet and exercise are improved recently; he has never had pancreatitis, pancreatic surgery, severe hypoglycemia or DKA.  He takes lantus 20/d, and metformin.  He says cbg's vary from 130-200.   Past Medical History:  Diagnosis Date  . Alcoholism ([place])   . Drug abuse ([place])   . Hypertension   . Stroke [facility])     Past Surgical History:  Procedure Laterality Date  . HERNIA REPAIR    . WISDOM TOOTH EXTRACTION      Social History   Socioeconomic History  . Marital status: Single    Spouse name: Not on file  . Number of children: Not on file  . Years of education: Not on file  . Highest education level: Not on file  Occupational History  . Not on file  Social Needs  . Financial resource strain: Not on file  . Food insecurity:    Worry: Not on file    Inability: Not on file  . Transportation needs:    Medical: Not on file    Non-medical: Not on file  Tobacco Use  . Smoking status: Former Research scientist (life sciences)  . Smokeless tobacco: Never Used  Substance and Sexual Activity  . Alcohol use: No    Frequency: Never  . Drug use: No    Comment: Former drug and alcohol use.  Marland Kitchen Sexual activity: Not on file  Lifestyle  . Physical activity:    Days per week: Not on file    Minutes per session: Not on file  . Stress: Not on file  Relationships  . Social connections:    Talks on phone: Not on file    Gets together: Not on file    Attends religious service: Not on file    Active member of club or organization: Not on file    Attends meetings of clubs or organizations: Not on file    Relationship status: Not on file  . Intimate partner violence:    Fear  of current or ex partner: Not on file    Emotionally abused: Not on file    Physically abused: Not on file    Forced sexual activity: Not on file  Other Topics Concern  . Not on file  Social History Narrative  . Not on file    Current Outpatient Medications on File Prior to Visit  Medication Sig Dispense Refill  . atorvastatin (LIPITOR) 20 MG tablet Take 1 tablet (20 mg total) by mouth daily. 90 tablet 3  . Blood Glucose Monitoring Suppl (ACCU-CHEK AVIVA PLUS) w/Device KIT 1 each by Does not apply route 3 (three) times daily. 1 kit 0  . glucose blood (ACCU-CHEK AVIVA) test strip Use Three times daily as directed 100 each 12  . ibuprofen (ADVIL,MOTRIN) 200 MG tablet Take 200 mg by mouth every 6 (six) hours as needed.    . lamoTRIgine (LAMICTAL) 150 MG tablet Take 300 mg by mouth daily.    . Lancets MISC 1 each by Does not apply route 4 (four) times daily -  before meals and at bedtime. 100 each 11  . metFORMIN (GLUCOPHAGE) 1000  MG tablet Take 1 tablet (1,000 mg total) by mouth 2 (two) times daily with a meal. 60 tablet 5  . lisinopril (PRINIVIL,ZESTRIL) 10 MG tablet Take 1 tablet (10 mg total) by mouth daily. (Patient not taking: Reported on 04/13/2018) 30 tablet 11   No current facility-administered medications on file prior to visit.     No Known Allergies  Family History  Problem Relation Age of Onset  . Hypertension Mother   . Diabetes Paternal Aunt     BP (!) 140/100 (BP Location: Left Arm, Patient Position: Sitting, Cuff Size: Normal)   Pulse 96   Temp 98.5 F (36.9 C) (Oral)   Ht '5\' 8"'$  (1.727 m)   Wt 203 lb 3.2 oz (92.2 kg)   SpO2 95%   BMI 30.90 kg/m    Review of Systems denies blurry vision, chest pain, sob, n/v, muscle cramps, excessive diaphoresis, memory loss, insomnia, cold intolerance, hypoglycemia, rhinorrhea, and easy bruising.  He has weight gain.   He has chronic headache and polyuria.     Objective:   Physical Exam VS: see vs page GEN: no  distress HEAD: head: no deformity eyes: no periorbital swelling, no proptosis external nose and ears are normal mouth: no lesion seen NECK: supple, thyroid is not enlarged CHEST WALL: no deformity LUNGS: clear to auscultation CV: reg rate and rhythm, no murmur ABD: abdomen is soft, nontender.  no hepatosplenomegaly.  not distended.  no hernia MUSCULOSKELETAL: muscle bulk and strength are grossly normal.  no obvious joint swelling.  gait is normal and steady EXTEMITIES: no deformity.  no ulcer on the feet.  feet are of normal color and temp.  no edema PULSES: dorsalis pedis intact bilat.  no carotid bruit NEURO:  cn 2-12 grossly intact.   readily moves all 4's.  sensation is intact to touch on the feet SKIN:  Normal texture and temperature.  No rash or suspicious lesion is visible.   NODES:  None palpable at the neck.   PSYCH: alert, well-oriented.  appears anxious but not depressed.    Lab Results  Component Value Date   HGBA1C 7.1 (A) 04/13/2018   Lab Results  Component Value Date   CREATININE 0.88 04/13/2018   BUN 11 04/13/2018   NA 141 04/13/2018   K 4.8 04/13/2018   CL 101 04/13/2018   CO2 23 04/13/2018       Assessment & Plan:  type 2 DM: he may be manageable off insulin.  Substance abuse: he needs the simplest possible insulin regimen.  HTN: is noted today.   Patient Instructions  Your blood pressure is high today.  Please see your primary care provider soon, to have it rechecked good diet and exercise significantly improve the control of your diabetes.  please let me know if you wish to be referred to a dietician.  high blood sugar is very risky to your health.  you should see an eye doctor and dentist every year.  It is very important to get all recommended vaccinations.  Controlling your blood pressure and cholesterol drastically reduces the damage diabetes does to your body.  Those who smoke should quit.  Please discuss these with your doctor.  check your blood  sugar twice a day.  vary the time of day when you check, between before the 3 meals, and at bedtime.  also check if you have symptoms of your blood sugar being too high or too low.  please keep a record of the readings and bring it  to your next appointment here (or you can bring the meter itself).  You can write it on any piece of paper.  please call us sooner if your blood sugar goes below 70, or if you have a lot of readings over 200.   I have sent a prescription to your pharmacy, to add "januvia." Also, please reduce the insulin to 10 units each morning. Please continue the same metformin.   Please come back for a follow-up appointment in 2 months.

## 2018-04-16 NOTE — Patient Instructions (Addendum)
Your blood pressure is high today.  Please see your primary care provider soon, to have it rechecked good diet and exercise significantly improve the control of your diabetes.  please let me know if you wish to be referred to a dietician.  high blood sugar is very risky to your health.  you should see an eye doctor and dentist every year.  It is very important to get all recommended vaccinations.  Controlling your blood pressure and cholesterol drastically reduces the damage diabetes does to your body.  Those who smoke should quit.  Please discuss these with your doctor.  check your blood sugar twice a day.  vary the time of day when you check, between before the 3 meals, and at bedtime.  also check if you have symptoms of your blood sugar being too high or too low.  please keep a record of the readings and bring it to your next appointment here (or you can bring the meter itself).  You can write it on any piece of paper.  please call us sooner if your blood sugar goes below 70, or if you have a lot of readings over 200.   I have sent a prescription to your pharmacy, to add "januvia." Also, please reduce the insulin to 10 units each morning. Please continue the same metformin.   Please come back for a follow-up appointment in 2 months.

## 2018-04-18 ENCOUNTER — Other Ambulatory Visit: Payer: Self-pay | Admitting: Family Medicine

## 2018-04-18 ENCOUNTER — Encounter: Payer: Self-pay | Admitting: Family Medicine

## 2018-04-18 DIAGNOSIS — R7612 Nonspecific reaction to cell mediated immunity measurement of gamma interferon antigen response without active tuberculosis: Secondary | ICD-10-CM

## 2018-04-27 ENCOUNTER — Telehealth: Payer: Self-pay | Admitting: Family Medicine

## 2018-04-27 NOTE — Telephone Encounter (Signed)
Called and spoke with patient. He has called his eye doctor and has an appointment set up for Monday 04/30/2018. Thanks!

## 2018-04-27 NOTE — Telephone Encounter (Signed)
Pt called to request name of doctor who he may be referred to for an eye appt

## 2018-05-24 ENCOUNTER — Ambulatory Visit: Payer: Medicaid Other | Admitting: Family Medicine

## 2018-06-19 ENCOUNTER — Ambulatory Visit: Payer: Medicaid Other | Admitting: Endocrinology

## 2018-06-19 DIAGNOSIS — Z0289 Encounter for other administrative examinations: Secondary | ICD-10-CM

## 2018-07-25 ENCOUNTER — Ambulatory Visit: Payer: Medicaid Other | Admitting: Endocrinology

## 2018-07-25 DIAGNOSIS — Z0289 Encounter for other administrative examinations: Secondary | ICD-10-CM

## 2018-07-26 ENCOUNTER — Telehealth: Payer: Self-pay

## 2018-07-26 NOTE — Telephone Encounter (Signed)
Patient will be at appointment on tomorrow at 1pm.

## 2018-07-27 ENCOUNTER — Ambulatory Visit (INDEPENDENT_AMBULATORY_CARE_PROVIDER_SITE_OTHER): Payer: Medicaid Other | Admitting: Family Medicine

## 2018-07-27 ENCOUNTER — Encounter: Payer: Self-pay | Admitting: Family Medicine

## 2018-07-27 VITALS — BP 136/100 | HR 96 | Temp 98.6°F | Resp 16 | Ht 68.0 in | Wt 198.6 lb

## 2018-07-27 DIAGNOSIS — E785 Hyperlipidemia, unspecified: Secondary | ICD-10-CM

## 2018-07-27 DIAGNOSIS — E119 Type 2 diabetes mellitus without complications: Secondary | ICD-10-CM | POA: Diagnosis not present

## 2018-07-27 DIAGNOSIS — I10 Essential (primary) hypertension: Secondary | ICD-10-CM

## 2018-07-27 LAB — POCT URINALYSIS DIPSTICK
Bilirubin, UA: NEGATIVE
Blood, UA: NEGATIVE
Glucose, UA: POSITIVE — AB
Ketones, UA: NEGATIVE
Leukocytes, UA: NEGATIVE
Nitrite, UA: NEGATIVE
Protein, UA: NEGATIVE
Spec Grav, UA: 1.01 (ref 1.010–1.025)
Urobilinogen, UA: 0.2 E.U./dL
pH, UA: 5.5 (ref 5.0–8.0)

## 2018-07-27 LAB — POCT GLYCOSYLATED HEMOGLOBIN (HGB A1C): Hemoglobin A1C: 6.5 % — AB (ref 4.0–5.6)

## 2018-07-27 LAB — GLUCOSE, POCT (MANUAL RESULT ENTRY): POC Glucose: 284 mg/dl — AB (ref 70–99)

## 2018-07-27 MED ORDER — SITAGLIPTIN PHOSPHATE 100 MG PO TABS: 100.0000 mg | ORAL_TABLET | Freq: Every day | ORAL | 2 refills | Status: DC

## 2018-07-27 MED ORDER — LISINOPRIL 10 MG PO TABS: 10.0000 mg | ORAL_TABLET | Freq: Every day | ORAL | 2 refills | Status: DC

## 2018-07-27 MED ORDER — METFORMIN HCL 1000 MG PO TABS: 1000.0000 mg | ORAL_TABLET | Freq: Two times a day (BID) | ORAL | 2 refills | Status: DC

## 2018-07-27 MED ORDER — ATORVASTATIN CALCIUM 20 MG PO TABS: 20.0000 mg | ORAL_TABLET | Freq: Every day | ORAL | 2 refills | Status: DC

## 2018-07-27 NOTE — Patient Instructions (Signed)
Diabetes Mellitus and Standards of Medical Care Managing diabetes (diabetes mellitus) can be complicated. Your diabetes treatment may be managed by a team of health care providers, including:  A diet and nutrition specialist (registered dietitian).  A nurse.  A certified diabetes educator (CDE).  A diabetes specialist (endocrinologist).  An eye doctor.  A primary care provider.  A dentist.  Your health care providers follow a schedule in order to help you get the best quality of care. The following schedule is a general guideline for your diabetes management plan. Your health care providers may also give you more specific instructions. HbA1c ( hemoglobin A1c) test This test provides information about blood sugar (glucose) control over the previous 2-3 months. It is used to check whether your diabetes management plan needs to be adjusted.  If you are meeting your treatment goals, this test is done at least 2 times a year.  If you are not meeting treatment goals or if your treatment goals have changed, this test is done 4 times a year.  Blood pressure test  This test is done at every routine medical visit. For most people, the goal is less than 130/80. Ask your health care provider what your goal blood pressure should be. Dental and eye exams  Visit your dentist two times a year.  If you have type 1 diabetes, get an eye exam 3-5 years after you are diagnosed, and then once a year after your first exam. ? If you were diagnosed with type 1 diabetes as a child, get an eye exam when you are age 16 or older and have had diabetes for 3-5 years. After the first exam, you should get an eye exam once a year.  If you have type 2 diabetes, have an eye exam as soon as you are diagnosed, and then once a year after your first exam. Foot care exam  Visual foot exams are done at every routine medical visit. The exams check for cuts, bruises, redness, blisters, sores, or other problems with the  feet.  A complete foot exam is done by your health care provider once a year. This exam includes an inspection of the structure and skin of your feet, and a check of the pulses and sensation in your feet. ? Type 1 diabetes: Get your first exam 3-5 years after diagnosis. ? Type 2 diabetes: Get your first exam as soon as you are diagnosed.  Check your feet every day for cuts, bruises, redness, blisters, or sores. If you have any of these or other problems that are not healing, contact your health care provider. Kidney function test ( urine microalbumin)  This test is done once a year. ? Type 1 diabetes: Get your first test 5 years after diagnosis. ? Type 2 diabetes: Get your first test as soon as you are diagnosed.  If you have chronic kidney disease (CKD), get a serum creatinine and estimated glomerular filtration rate (eGFR) test once a year. Lipid profile (cholesterol, HDL, LDL, triglycerides)  This test should be done when you are diagnosed with diabetes, and every 5 years after the first test. If you are on medicines to lower your cholesterol, you may need to get this test done every year. ? The goal for LDL is less than 100 mg/dL (5.5 mmol/L). If you are at high risk, the goal is less than 70 mg/dL (3.9 mmol/L). ? The goal for HDL is 40 mg/dL (2.2 mmol/L) for men and 50 mg/dL(2.8 mmol/L) for women. An HDL  cholesterol of 60 mg/dL (3.3 mmol/L) or higher gives some protection against heart disease. ? The goal for triglycerides is less than 150 mg/dL (8.3 mmol/L). Immunizations  The yearly flu (influenza) vaccine is recommended for everyone 6 months or older who has diabetes.  The pneumonia (pneumococcal) vaccine is recommended for everyone 2 years or older who has diabetes. If you are 49 or older, you may get the pneumonia vaccine as a series of two separate shots.  The hepatitis B vaccine is recommended for adults shortly after they have been diagnosed with diabetes.  The Tdap  (tetanus, diphtheria, and pertussis) vaccine should be given: ? According to normal childhood vaccination schedules, for children. ? Every 10 years, for adults who have diabetes.  The shingles vaccine is recommended for people who have had chicken pox and are 50 years or older. Mental and emotional health  Screening for symptoms of eating disorders, anxiety, and depression is recommended at the time of diagnosis and afterward as needed. If your screening shows that you have symptoms (you have a positive screening result), you may need further evaluation and be referred to a mental health care provider. Diabetes self-management education  Education about how to manage your diabetes is recommended at diagnosis and ongoing as needed. Treatment plan  Your treatment plan will be reviewed at every medical visit. Summary  Managing diabetes (diabetes mellitus) can be complicated. Your diabetes treatment may be managed by a team of health care providers.  Your health care providers follow a schedule in order to help you get the best quality of care.  Standards of care including having regular physical exams, blood tests, blood pressure monitoring, immunizations, screening tests, and education about how to manage your diabetes.  Your health care providers may also give you more specific instructions based on your individual health. This information is not intended to replace advice given to you by your health care provider. Make sure you discuss any questions you have with your health care provider. Document Released: 07/17/2009 Document Revised: 06/17/2016 Document Reviewed: 06/17/2016 Elsevier Interactive Patient Education  2018 [employer].     Diabetes Mellitus and Nutrition When you have diabetes (diabetes mellitus), it is very important to have healthy eating habits because your blood sugar (glucose) levels are greatly affected by what you eat and drink. Eating healthy foods in the  appropriate amounts, at about the same times every day, can help you:  Control your blood glucose.  Lower your risk of heart disease.  Improve your blood pressure.  Reach or maintain a healthy weight.  Every person with diabetes is different, and each person has different needs for a meal plan. Your health care provider may recommend that you work with a diet and nutrition specialist (dietitian) to make a meal plan that is best for you. Your meal plan may vary depending on factors such as:  The calories you need.  The medicines you take.  Your weight.  Your blood glucose, blood pressure, and cholesterol levels.  Your activity level.  Other health conditions you have, such as heart or kidney disease.  How do carbohydrates affect me? Carbohydrates affect your blood glucose level more than any other type of food. Eating carbohydrates naturally increases the amount of glucose in your blood. Carbohydrate counting is a method for keeping track of how many carbohydrates you eat. Counting carbohydrates is important to keep your blood glucose at a healthy level, especially if you use insulin or take certain oral diabetes medicines. It is  important to know how many carbohydrates you can safely have in each meal. This is different for every person. Your dietitian can help you calculate how many carbohydrates you should have at each meal and for snack. Foods that contain carbohydrates include:  Bread, cereal, rice, pasta, and crackers.  Potatoes and corn.  Peas, beans, and lentils.  Milk and yogurt.  Fruit and juice.  Desserts, such as cakes, cookies, ice cream, and candy.  How does alcohol affect me? Alcohol can cause a sudden decrease in blood glucose (hypoglycemia), especially if you use insulin or take certain oral diabetes medicines. Hypoglycemia can be a life-threatening condition. Symptoms of hypoglycemia (sleepiness, dizziness, and confusion) are similar to symptoms of having  too much alcohol. If your health care provider says that alcohol is safe for you, follow these guidelines:  Limit alcohol intake to no more than 1 drink per day for nonpregnant women and 2 drinks per day for men. One drink equals 12 oz of beer, 5 oz of wine, or 1 oz of hard liquor.  Do not drink on an empty stomach.  Keep yourself hydrated with water, diet soda, or unsweetened iced tea.  Keep in mind that regular soda, juice, and other mixers may contain a lot of sugar and must be counted as carbohydrates.  What are tips for following this plan? Reading food labels  Start by checking the serving size on the label. The amount of calories, carbohydrates, fats, and other nutrients listed on the label are based on one serving of the food. Many foods contain more than one serving per package.  Check the total grams (g) of carbohydrates in one serving. You can calculate the number of servings of carbohydrates in one serving by dividing the total carbohydrates by 15. For example, if a food has 30 g of total carbohydrates, it would be equal to 2 servings of carbohydrates.  Check the number of grams (g) of saturated and trans fats in one serving. Choose foods that have low or no amount of these fats.  Check the number of milligrams (mg) of sodium in one serving. Most people should limit total sodium intake to less than 2,300 mg per day.  Always check the nutrition information of foods labeled as "low-fat" or "nonfat". These foods may be higher in added sugar or refined carbohydrates and should be avoided.  Talk to your dietitian to identify your daily goals for nutrients listed on the label. Shopping  Avoid buying canned, premade, or processed foods. These foods tend to be high in fat, sodium, and added sugar.  Shop around the outside edge of the grocery store. This includes fresh fruits and vegetables, bulk grains, fresh meats, and fresh dairy. Cooking  Use low-heat cooking methods, such as  baking, instead of high-heat cooking methods like deep frying.  Cook using healthy oils, such as olive, canola, or sunflower oil.  Avoid cooking with butter, cream, or high-fat meats. Meal planning  Eat meals and snacks regularly, preferably at the same times every day. Avoid going long periods of time without eating.  Eat foods high in fiber, such as fresh fruits, vegetables, beans, and whole grains. Talk to your dietitian about how many servings of carbohydrates you can eat at each meal.  Eat 4-6 ounces of lean protein each day, such as lean meat, chicken, fish, eggs, or tofu. 1 ounce is equal to 1 ounce of meat, chicken, or fish, 1 egg, or 1/4 cup of tofu.  Eat some foods each day  that contain healthy fats, such as avocado, nuts, seeds, and fish. Lifestyle   Check your blood glucose regularly.  Exercise at least 30 minutes 5 or more days each week, or as told by your health care provider.  Take medicines as told by your health care provider.  Do not use any products that contain nicotine or tobacco, such as cigarettes and e-cigarettes. If you need help quitting, ask your health care provider.  Work with a Social worker or diabetes educator to identify strategies to manage stress and any emotional and social challenges. What are some questions to ask my health care provider?  Do I need to meet with a diabetes educator?  Do I need to meet with a dietitian?  What number can I call if I have questions?  When are the best times to check my blood glucose? Where to find more information:  American Diabetes Association: diabetes.org/food-and-fitness/food  Academy of Nutrition and Dietetics: PokerClues.dk  [employer] of Diabetes and Digestive and Kidney Diseases (NIH): ContactWire.be Summary  A healthy meal plan will help you control your blood glucose  and maintain a healthy lifestyle.  Working with a diet and nutrition specialist (dietitian) can help you make a meal plan that is best for you.  Keep in mind that carbohydrates and alcohol have immediate effects on your blood glucose levels. It is important to count carbohydrates and to use alcohol carefully. This information is not intended to replace advice given to you by your health care provider. Make sure you discuss any questions you have with your health care provider. Document Released: 06/16/2005 Document Revised: 10/24/2016 Document Reviewed: 10/24/2016 Elsevier Interactive Patient Education  [employer].

## 2018-07-27 NOTE — Progress Notes (Signed)
  [facility]   Progress Note: General Provider: Mike Gip, FNP  SUBJECTIVE:   Tommy House is a 52 y.o. male who  has a past medical history of Alcoholism (HCC), Drug abuse (HCC), Hypertension, and Stroke (HCC).. Patient presents today for Diabetes Patient states that he is moving and this will be his last appointment with this clinician at this clinic.  Patient states that he would like to have his labs and medications refilled prior to leaving.  He is doing well today and does not have any complaints. Review of Systems  Constitutional: Negative.   HENT: Negative.   Eyes: Negative.   Respiratory: Negative.   Cardiovascular: Negative.   Gastrointestinal: Negative.   Genitourinary: Negative.   Musculoskeletal: Negative.   Skin: Negative.   Neurological: Negative.   Psychiatric/Behavioral: Negative.      OBJECTIVE: BP (!) 137/95 (BP Location: Left Arm, Patient Position: Sitting, Cuff Size: Normal)   Pulse 96   Temp 98.6 F (37 C) (Oral)   Resp 16   Ht 5\' 8"  (1.727 m)   Wt 198 lb 9.6 oz (90.1 kg)   SpO2 96%   BMI 30.20 kg/m   Physical Exam  Constitutional: He is oriented to person, place, and time. He appears well-developed and well-nourished. No distress.  HENT:  Head: Normocephalic and atraumatic.  Eyes: Pupils are equal, round, and reactive to light. Conjunctivae and EOM are normal.  Neck: Normal range of motion.  Cardiovascular: Normal rate, regular rhythm, normal heart sounds and intact distal pulses.  Pulmonary/Chest: Effort normal and breath sounds normal. No respiratory distress.  Abdominal: Soft. Bowel sounds are normal. He exhibits no distension.  Musculoskeletal: Normal range of motion.  Neurological: He is alert and oriented to person, place, and time.  Skin: Skin is warm and dry.  Psychiatric: He has a normal mood and affect. His behavior is normal. Thought content normal.  Nursing note and vitals  reviewed.   ASSESSMENT/PLAN:   1. Type 2 diabetes mellitus without complication, unspecified whether long term insulin use (HCC) - Urinalysis Dipstick - HgB A1c - Glucose (CBG) - sitaGLIPtin (JANUVIA) 100 MG tablet; Take 1 tablet (100 mg total) by mouth daily.  Dispense: 90 tablet; Refill: 2 - metFORMIN (GLUCOPHAGE) 1000 MG tablet; Take 1 tablet (1,000 mg total) by mouth 2 (two) times daily with a meal.  Dispense: 180 tablet; Refill: 2 - atorvastatin (LIPITOR) 20 MG tablet; Take 1 tablet (20 mg total) by mouth daily.  Dispense: 90 tablet; Refill: 2 - Comprehensive metabolic panel  2. Essential hypertension - lisinopril (PRINIVIL,ZESTRIL) 10 MG tablet; Take 1 tablet (10 mg total) by mouth daily.  Dispense: 90 tablet; Refill: 2  3. Hyperlipidemia, unspecified hyperlipidemia type - atorvastatin (LIPITOR) 20 MG tablet; Take 1 tablet (20 mg total) by mouth daily.  Dispense: 90 tablet; Refill: 2   Refilled all patient's medications.  Patient to find new provider at his new location.     The patient was given clear instructions to go to ER or return to medical center if symptoms do not improve, worsen or new problems develop. The patient verbalized understanding and agreed with plan of care.   Ms. Freda Jackson. Riley Lam, FNP-BC [facility] [address] [place], [place] [address] [phone]     This note has been created with Dragon speech recognition software and smart phrase technology. Any transcriptional errors are unintentional.

## 2018-07-28 LAB — COMPREHENSIVE METABOLIC PANEL
ALT: 43 IU/L (ref 0–44)
AST: 19 IU/L (ref 0–40)
Albumin/Globulin Ratio: 2.6 — ABNORMAL HIGH (ref 1.2–2.2)
Albumin: 4.6 g/dL (ref 3.5–5.5)
Alkaline Phosphatase: 98 IU/L (ref 39–117)
BUN/Creatinine Ratio: 19 (ref 9–20)
BUN: 18 mg/dL (ref 6–24)
Bilirubin Total: 0.8 mg/dL (ref 0.0–1.2)
CO2: 22 mmol/L (ref 20–29)
Calcium: 9 mg/dL (ref 8.7–10.2)
Chloride: 98 mmol/L (ref 96–106)
Creatinine, Ser: 0.94 mg/dL (ref 0.76–1.27)
GFR calc Af Amer: 108 mL/min/{1.73_m2} (ref >59)
GFR calc non Af Amer: 93 mL/min/{1.73_m2} (ref >59)
Globulin, Total: 1.8 g/dL (ref 1.5–4.5)
Glucose: 305 mg/dL — ABNORMAL HIGH (ref 65–99)
Potassium: 4.4 mmol/L (ref 3.5–5.2)
Sodium: 136 mmol/L (ref 134–144)
Total Protein: 6.4 g/dL (ref 6.0–8.5)

## 2018-12-19 ENCOUNTER — Telehealth: Payer: Self-pay

## 2018-12-19 ENCOUNTER — Telehealth: Payer: Medicaid Other | Admitting: Family

## 2018-12-19 DIAGNOSIS — R6889 Other general symptoms and signs: Secondary | ICD-10-CM

## 2018-12-19 DIAGNOSIS — R509 Fever, unspecified: Secondary | ICD-10-CM

## 2018-12-19 NOTE — Progress Notes (Signed)
Based on what you shared with me, I feel that you are considered high risk for Corona virus virus because of a known exposure, fever, shortness of breath and cough.  You should proceed to our testing site at [address] [place] [address].   Approximately 5 minutes was spent documenting and reviewing patient's chart.    I have placed an order for you to have the cornoavirus (COVID19) test done.  - You will be tested for (COVID-19) and discharged home on quarantine except to seek medical care if symptoms worsen, and asked to  - Stay home and avoid contact with others until you get your results (4-5 days)  - Avoid travel on public transportation if possible (such as bus, train, or airplane)  Continue to monitor at home and seek medical attention if your symptoms worsen.  If you are having a medical emergency, call 911.   Please review the forms below as these are required. PRINT, sign and complete and bring with you if possible to the testing site.     Person Under Monitoring Name: Tommy House  Location: [address] [place] [address]   CORONAVIRUS DISEASE 2019 (COVID-19) Guidance for Persons Under Investigation You are being tested for the virus that causes coronavirus disease 2019 (COVID-19). Public health actions are necessary to ensure protection of your health and the health of others, and to prevent further spread of infection. COVID-19 is caused by a virus that can cause symptoms, such as fever, cough, and shortness of breath. The primary transmission from person to person is by coughing or sneezing. On November 01, 2018, the World Health Organization announced a [employer] Emergency of International Concern and on November 02, 2018 the U.S. Department of Health and Human Services declared a public health emergency. If the virus that causesCOVID-19 spreads in the community, it could have severe public health consequences.  As a person under investigation for  COVID-19, the [employer] of Health and [employer], Division of [employer] advises you to adhere to the following guidance until your test results are reported to you. If your test result is positive, you will receive additional information from your provider and your local health department at that time.   Remain at home until you are cleared by your health provider or public health authorities.   Keep a log of visitors to your home using the form provided. Any visitors to your home must be aware of your isolation status.  If you plan to move to a new address or leave the county, notify the local health department in your county.  Call a doctor or seek care if you have an urgent medical need. Before seeking medical care, call ahead and get instructions from the provider before arriving at the medical office, clinic or hospital. Notify them that you are being tested for the virus that causes COVID-19 so arrangements can be made, as necessary, to prevent transmission to others in the healthcare setting. Next, notify the local health department in your county.  If a medical emergency arises and you need to call 911, inform the first responders that you are being tested for the virus that causes COVID-19. Next, notify the local health department in your county.  Adhere to all guidance set forth by the [facility] Division of [employer] for [facility] of patients that is based on guidance from the Center for Disease Control and Prevention with suspected or confirmed COVID-19. It is provided with  this guidance for Persons Under Investigation.  Your health and the health of our community are our top priorities. Public Health officials remain available to provide assistance and counseling to you about COVID-19 and compliance with this guidance.  Provider: ____________________________________________________________ Date: ______/_____/_________  By signing below, you acknowledge  that you have read and agree to comply with this Guidance for Persons Under Investigation. ______________________________________________________________ Date: ______/_____/_________  WHO DO I CALL? You can find a list of local health departments here: http://dean.org/ Health Department: ____________________________________________________________________ Contact Name: ________________________________________________________________________ Telephone: ___________________________________________________________________________  Nedra Hai, Division of Public Health, Communicable Disease Branch COVID-19 Guidance for Persons Under Investigation December 08, 2018   Person Under Monitoring Name: Tommy House  Location: [address] [place] [address]   Record here the list of visitors to your home since you became ill with respiratory symptoms that led you to consult a health provider:  Visitor Name Date Time In Time Out Did this person come within 6 feet of you? Indicate Y or N Relationship to Person Under Monitoring Phone number Comments   ___/____/____ __:__ AM/PM __:__ AM/PM       ___/____/____ __:__ AM/PM __:__ AM/PM       ___/____/____ __:__ AM/PM __:__ AM/PM       ___/____/____ __:__ AM/PM __:__ AM/PM       ___/____/____ __:__ AM/PM __:__ AM/PM       ___/____/____ __:__ AM/PM __:__ AM/PM       ___/____/____ __:__ AM/PM __:__ AM/PM       ___/____/____ __:__ AM/PM __:__ AM/PM       ___/____/____ __:__ AM/PM __:__ AM/PM       ___/____/____ __:__ AM/PM __:__ AM/PM       ___/____/____ __:__ AM/PM __:__ AM/PM       ___/____/____ __:__ AM/PM __:__ AM/PM       ___/____/____ __:__ AM/PM __:__ AM/PM       ___/____/____ __:__ AM/PM __:__ AM/PM       Nedra Hai, Division of Public Health, Communicable Disease Branch

## 2018-12-24 ENCOUNTER — Telehealth: Payer: Self-pay

## 2018-12-24 NOTE — Telephone Encounter (Signed)
Informed patient that someone with the drive up tent will call him with his test results.

## 2018-12-24 NOTE — Telephone Encounter (Signed)
Priority Sent On From To Message Type   12/24/2018 8:52 AM Allayne Butcher, NT Mariane Duval, CMA Patient Calls   Comment: Pt is requesting a call back with his Covid-19 test results. Thanks

## 2018-12-26 LAB — NOVEL CORONAVIRUS, NAA: SARS-COV-2, NAA: NOT DETECTED

## 2019-06-12 ENCOUNTER — Encounter (HOSPITAL_COMMUNITY): Payer: Self-pay | Admitting: *Deleted

## 2019-06-12 ENCOUNTER — Encounter (HOSPITAL_COMMUNITY): Payer: Self-pay

## 2019-09-09 ENCOUNTER — Telehealth (LOCAL_COMMUNITY_HEALTH_CENTER): Payer: Self-pay | Admitting: General Practice

## 2019-09-09 NOTE — Telephone Encounter (Signed)
[place] Tb positive. Needs treatment

## 2019-09-10 DIAGNOSIS — F411 Generalized anxiety disorder: Secondary | ICD-10-CM | POA: Diagnosis not present

## 2019-09-10 DIAGNOSIS — F3112 Bipolar disorder, current episode manic without psychotic features, moderate: Secondary | ICD-10-CM | POA: Diagnosis not present

## 2019-09-11 ENCOUNTER — Encounter: Payer: Self-pay | Admitting: General Practice

## 2019-09-11 ENCOUNTER — Other Ambulatory Visit (LOCAL_COMMUNITY_HEALTH_CENTER): Payer: Medicaid Other

## 2019-09-11 ENCOUNTER — Other Ambulatory Visit: Payer: Self-pay

## 2019-09-11 VITALS — Ht 62.0 in | Wt 201.0 lb

## 2019-09-11 DIAGNOSIS — Z0389 Encounter for observation for other suspected diseases and conditions ruled out: Secondary | ICD-10-CM | POA: Diagnosis not present

## 2019-09-11 DIAGNOSIS — Z1388 Encounter for screening for disorder due to exposure to contaminants: Secondary | ICD-10-CM | POA: Diagnosis not present

## 2019-09-11 DIAGNOSIS — R7612 Nonspecific reaction to cell mediated immunity measurement of gamma interferon antigen response without active tuberculosis: Secondary | ICD-10-CM | POA: Insufficient documentation

## 2019-09-11 DIAGNOSIS — Z3009 Encounter for other general counseling and advice on contraception: Secondary | ICD-10-CM | POA: Diagnosis not present

## 2019-09-11 NOTE — Progress Notes (Signed)
See phone note on 09/10/19 for EPI. 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 TB RN will call patient once results are completed and schedule TBM appt. Patient would like to start ASAP. [name], RN

## 2019-09-11 NOTE — Telephone Encounter (Signed)
EPI completed via phone (no weight for vitals). RN will obtain weight at lab appt 09/11/19. Patient recently moved here from [place]. Began LTBI tx but had to move; tx incomplete.  +QGT and normal CXR in 2019 (see Epic). Patient would like to start tx as quickly as possible.  Will be returning to the [place] early spring. [name], RN

## 2019-09-12 ENCOUNTER — Telehealth: Payer: Self-pay

## 2019-09-12 ENCOUNTER — Other Ambulatory Visit: Payer: Self-pay | Admitting: Family Medicine

## 2019-09-12 DIAGNOSIS — R7612 Nonspecific reaction to cell mediated immunity measurement of gamma interferon antigen response without active tuberculosis: Secondary | ICD-10-CM

## 2019-09-12 LAB — HEPATIC FUNCTION PANEL
ALT: 55 IU/L — ABNORMAL HIGH (ref 0–44)
AST: 22 IU/L (ref 0–40)
Albumin: 4.8 g/dL (ref 3.8–4.9)
Alkaline Phosphatase: 97 IU/L (ref 39–117)
Bilirubin Total: 0.7 mg/dL (ref 0.0–1.2)
Bilirubin, Direct: 0.16 mg/dL (ref 0.00–0.40)
Total Protein: 7 g/dL (ref 6.0–8.5)

## 2019-09-12 LAB — CBC WITH DIFFERENTIAL/PLATELET
Basophils Absolute: 0.1 10*3/uL (ref 0.0–0.2)
Basos: 1 %
EOS (ABSOLUTE): 0.2 10*3/uL (ref 0.0–0.4)
Eos: 3 %
Hematocrit: 51.2 % — ABNORMAL HIGH (ref 37.5–51.0)
Hemoglobin: 17.4 g/dL (ref 13.0–17.7)
Immature Grans (Abs): 0 10*3/uL (ref 0.0–0.1)
Immature Granulocytes: 0 %
Lymphocytes Absolute: 2.5 10*3/uL (ref 0.7–3.1)
Lymphs: 37 %
MCH: 29.1 pg (ref 26.6–33.0)
MCHC: 34 g/dL (ref 31.5–35.7)
MCV: 86 fL (ref 79–97)
Monocytes Absolute: 0.7 10*3/uL (ref 0.1–0.9)
Monocytes: 10 %
Neutrophils Absolute: 3.4 10*3/uL (ref 1.4–7.0)
Neutrophils: 49 %
Platelets: 296 10*3/uL (ref 150–450)
RBC: 5.98 x10E6/uL — ABNORMAL HIGH (ref 4.14–5.80)
RDW: 13.1 % (ref 11.6–15.4)
WBC: 6.9 10*3/uL (ref 3.4–10.8)

## 2019-09-12 NOTE — Telephone Encounter (Signed)
[name].  Discussed recent labs and scheduled #1 TBM appt for 09/16/19 [name], RN

## 2019-09-12 NOTE — Progress Notes (Signed)
Curator for clinical support staff:  Patient will undergo LTBI treatment per RN documentation.   Caren Macadam, MD, MPH, ABFM [facility] Medical Director

## 2019-09-12 NOTE — Progress Notes (Signed)
Tuberculosis treatment orders  All patients are to be monitored per [place] and county TB policies.    Rifampin 600mg  daily by mouth x 4 months, draw LFTs monthly per Dr. Ernestina Patches.   +QGT 04/09/2018; normal CXR 04/13/2018- See Epic.  DM, HTN, Hyperlipidemia, Former substance abuse.

## 2019-09-16 ENCOUNTER — Ambulatory Visit (LOCAL_COMMUNITY_HEALTH_CENTER): Payer: Self-pay

## 2019-09-16 ENCOUNTER — Other Ambulatory Visit: Payer: Self-pay

## 2019-09-16 VITALS — Wt 203.0 lb

## 2019-09-16 DIAGNOSIS — R7612 Nonspecific reaction to cell mediated immunity measurement of gamma interferon antigen response without active tuberculosis: Secondary | ICD-10-CM

## 2019-09-16 MED ORDER — RIFAMPIN 300 MG PO CAPS: ORAL_CAPSULE | ORAL | 0 refills | Status: DC

## 2019-09-16 NOTE — Progress Notes (Signed)
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 Per client, he will be leaving for the [place] early spring and desires to have treatment completed by this time. Client without questions regarding significance of LTBI versus active TB disease. Counseled on how to take Rifampin and potential side effects. Also counseled to stop Rifampin and notify clinic should he have any problems and/or concerns with TLTBI. 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 Questions answered and appt scheduled for 10/13/2018. Appt reminder card declined as used My Chart. Rich Number, RN

## 2019-10-10 DIAGNOSIS — F3112 Bipolar disorder, current episode manic without psychotic features, moderate: Secondary | ICD-10-CM | POA: Diagnosis not present

## 2019-10-14 ENCOUNTER — Telehealth: Payer: Self-pay

## 2019-10-14 DIAGNOSIS — R7612 Nonspecific reaction to cell mediated immunity measurement of gamma interferon antigen response without active tuberculosis: Secondary | ICD-10-CM

## 2019-10-15 ENCOUNTER — Ambulatory Visit: Payer: Medicaid Other | Admitting: Internal Medicine

## 2019-10-15 DIAGNOSIS — M5416 Radiculopathy, lumbar region: Secondary | ICD-10-CM | POA: Diagnosis not present

## 2019-10-21 DIAGNOSIS — M5416 Radiculopathy, lumbar region: Secondary | ICD-10-CM | POA: Diagnosis not present

## 2019-10-23 DIAGNOSIS — F3112 Bipolar disorder, current episode manic without psychotic features, moderate: Secondary | ICD-10-CM | POA: Diagnosis not present

## 2019-10-23 NOTE — Telephone Encounter (Signed)
TC with patient.  Reports is now living in [place] in [place].  Apologized for missing Rifampin appt.  Patient states will call TB RN if wants to continue meds here at [facility] if he moves back or will call [facility] to finish Rifampin Richmond Campbell, RN

## 2019-10-24 DIAGNOSIS — Z20822 Contact with and (suspected) exposure to covid-19: Secondary | ICD-10-CM | POA: Diagnosis not present

## 2019-10-24 DIAGNOSIS — Z20828 Contact with and (suspected) exposure to other viral communicable diseases: Secondary | ICD-10-CM | POA: Diagnosis not present

## 2019-11-12 ENCOUNTER — Other Ambulatory Visit: Payer: Self-pay

## 2019-11-12 ENCOUNTER — Encounter: Payer: Self-pay | Admitting: Internal Medicine

## 2019-11-12 ENCOUNTER — Ambulatory Visit (INDEPENDENT_AMBULATORY_CARE_PROVIDER_SITE_OTHER): Payer: Medicaid Other | Admitting: Internal Medicine

## 2019-11-12 VITALS — BP 112/70 | HR 90 | Temp 98.9°F | Ht 62.0 in | Wt 201.0 lb

## 2019-11-12 DIAGNOSIS — M5136 Other intervertebral disc degeneration, lumbar region: Secondary | ICD-10-CM | POA: Insufficient documentation

## 2019-11-12 DIAGNOSIS — E1169 Type 2 diabetes mellitus with other specified complication: Secondary | ICD-10-CM

## 2019-11-12 DIAGNOSIS — E118 Type 2 diabetes mellitus with unspecified complications: Secondary | ICD-10-CM | POA: Insufficient documentation

## 2019-11-12 DIAGNOSIS — I10 Essential (primary) hypertension: Secondary | ICD-10-CM | POA: Diagnosis not present

## 2019-11-12 DIAGNOSIS — F319 Bipolar disorder, unspecified: Secondary | ICD-10-CM | POA: Insufficient documentation

## 2019-11-12 DIAGNOSIS — E785 Hyperlipidemia, unspecified: Secondary | ICD-10-CM

## 2019-11-12 DIAGNOSIS — K5904 Chronic idiopathic constipation: Secondary | ICD-10-CM | POA: Diagnosis not present

## 2019-11-12 DIAGNOSIS — R7612 Nonspecific reaction to cell mediated immunity measurement of gamma interferon antigen response without active tuberculosis: Secondary | ICD-10-CM | POA: Diagnosis not present

## 2019-11-12 DIAGNOSIS — M51369 Other intervertebral disc degeneration, lumbar region without mention of lumbar back pain or lower extremity pain: Secondary | ICD-10-CM | POA: Insufficient documentation

## 2019-11-12 MED ORDER — JARDIANCE 10 MG PO TABS: 10.0000 mg | ORAL_TABLET | Freq: Every day | ORAL | 1 refills | Status: AC

## 2019-11-12 MED ORDER — LANTUS SOLOSTAR 100 UNIT/ML ~~LOC~~ SOPN: 20.0000 [IU] | PEN_INJECTOR | Freq: Every day | SUBCUTANEOUS | 1 refills | Status: DC

## 2019-11-12 MED ORDER — LOVASTATIN 40 MG PO TABS: 40.0000 mg | ORAL_TABLET | Freq: Every day | ORAL | 1 refills | Status: DC

## 2019-11-12 MED ORDER — LISINOPRIL 20 MG PO TABS: 20.0000 mg | ORAL_TABLET | Freq: Every day | ORAL | 1 refills | Status: DC

## 2019-11-12 MED ORDER — PEN NEEDLES 32G X 5 MM MISC: 1.0000 | Freq: Every day | 1 refills | Status: AC

## 2019-11-12 NOTE — Patient Instructions (Addendum)
For constipation - try Miralax 1 dose 1-2 times per day - adjust to soft stools daily.

## 2019-11-12 NOTE — Progress Notes (Signed)
Date:  11/12/2019   Name:  Miriam Kestler   DOB:  April 28, 1966   MRN:  [id number]   Chief Complaint: Establish Care Patient is here to get refills on medications and have a PCP until he moves to the [place] in a few months.  His fiance lives there but he is having trouble getting back due to Covid. Diabetes He presents for his follow-up diabetic visit. He has type 2 diabetes mellitus. The initial diagnosis of diabetes was made 2 years ago. His disease course has been stable. Pertinent negatives for hypoglycemia include no headaches, nervousness/anxiousness or tremors. Associated symptoms include polydipsia (from jardiance) and polyuria. Pertinent negatives for diabetes include no chest pain, no fatigue and no weakness. Symptoms are stable. Diabetic complications include a CVA (remote hx of TIAs in the 1990s). Current diabetic treatments: Lantus, metformin and Jardiance. His weight is stable. He is following a generally healthy diet. He monitors blood glucose at home 1-2 x per day. His breakfast blood glucose is taken between 6-7 am. His breakfast blood glucose range is generally 140-180 mg/dl. An ACE inhibitor/angiotensin II receptor blocker is being taken.  Hyperlipidemia This is a chronic problem. The problem is controlled. Pertinent negatives include no chest pain or shortness of breath. Current antihyperlipidemic treatment includes statins. The current treatment provides significant improvement of lipids. There are no compliance problems.   Hypertension This is a chronic problem. The problem is controlled. Pertinent negatives include no chest pain, headaches, palpitations or shortness of breath. Past treatments include ACE inhibitors. Hypertensive end-organ damage includes CVA (remote hx of TIAs in the 1990s).  Back Pain This is a chronic problem. The pain is present in the lumbar spine. Pertinent negatives include no abdominal pain, chest pain, dysuria, headaches, numbness or weakness.  Treatments tried: being followed by [facility]; on Gabapentin and Flexeril.  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  He was started on Rifampin but stopped it for unclear reasons.  Per HD note, he has had incomplete treatment twice before.  I advised him to follow up now so he can hopefully complete a four month course. Bipolar disorder - patient is followed by Psych. Dr. Owens Shark at [facility].  He is Lamictal and doing well.  Lab Results  Component Value Date   CREATININE 0.94 07/27/2018   BUN 18 07/27/2018   NA 136 07/27/2018   K 4.4 07/27/2018   CL 98 07/27/2018   CO2 22 07/27/2018   Lab Results  Component Value Date   CHOL 221 (H) 11/08/2017   HDL 29 (L) 11/08/2017   LDLCALC 149 (H) 11/08/2017   TRIG 214 (H) 11/08/2017   CHOLHDL 7.6 (H) 11/08/2017   Lab Results  Component Value Date   TSH 4.990 (H) 04/13/2018   Lab Results  Component Value Date   HGBA1C 6.5 (A) 07/27/2018   Lab Results  Component Value Date   WBC 6.9 09/11/2019   HGB 17.4 09/11/2019   HCT 51.2 (H) 09/11/2019   MCV 86 09/11/2019   PLT 296 09/11/2019   Lab Results  Component Value Date   ALT 55 (H) 09/11/2019   AST 22 09/11/2019   ALKPHOS 97 09/11/2019   BILITOT 0.7 09/11/2019      Review of Systems  Constitutional: Negative for appetite change, fatigue and unexpected weight change.  Eyes: Negative for visual disturbance.  Respiratory: Negative for cough, shortness of breath and wheezing.   Cardiovascular: Negative for chest pain, palpitations and leg swelling.  Gastrointestinal: Positive for anal bleeding and constipation. Negative for abdominal pain and blood in stool.  Endocrine: Positive for polydipsia (from jardiance) and polyuria.  Genitourinary: Negative for dysuria and hematuria.  Musculoskeletal: Positive for back pain.  Skin: Negative for color change and rash.  Neurological: Negative for tremors, weakness, numbness and headaches.  Psychiatric/Behavioral:  Negative for dysphoric mood and sleep disturbance. The patient is not nervous/anxious.     Patient Active Problem List   Diagnosis Date Noted  . Type II diabetes mellitus with complication ([place]) 70/17/7939  . Essential hypertension 11/12/2019  . Chronic idiopathic constipation 11/12/2019  . Bipolar 1 disorder ([place]) 11/12/2019  . DDD (degenerative disc disease), lumbar 11/12/2019  . 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 Hyperlipidemia associated with type 2 diabetes mellitus ([place]) 12/06/2017    No Known Allergies  Past Surgical History:  Procedure Laterality Date  . HERNIA REPAIR    . WISDOM TOOTH EXTRACTION      Social History   Tobacco Use  . Smoking status: Former Smoker    Packs/day: 1.00    Years: 30.00    Pack years: 30.00    Types: Cigarettes    Quit date: 04/10/2019    Years since quitting: 0.5  . Smokeless tobacco: Former Systems developer    Types: Chew    Quit date: 04/10/2019  Substance Use Topics  . Alcohol use: No  . Drug use: Not Currently    Comment: Former drug and alcohol use.     Medication list has been reviewed and updated.  Current Meds  Medication Sig  . Apple Cider Vinegar 500 MG TABS Take 1 tablet by mouth daily.  . Blood Glucose Monitoring Suppl (ACCU-CHEK AVIVA PLUS) w/Device KIT 1 each by Does not apply route 3 (three) times daily.  . calcium citrate-vitamin D (CITRACAL+D) 315-200 MG-UNIT tablet Take 1 tablet by mouth 2 (two) times daily.  . cyclobenzaprine (FLEXERIL) 10 MG tablet Take 10 mg by mouth at bedtime. Dr. Ronnald Ramp- [facility]  . docusate sodium (COLACE) 100 MG capsule Take 100 mg by mouth 2 (two) times daily.  . empagliflozin (JARDIANCE) 10 MG TABS tablet Take 10 mg by mouth daily.  Marland Kitchen gabapentin (NEURONTIN) 300 MG capsule Take 300 mg by mouth 3 (three) times daily. Dr. Ronnald Ramp - [facility]  . glucose blood (ACCU-CHEK AVIVA) test strip Use Three times daily as directed  . Insulin Glargine (LANTUS  SOLOSTAR) 100 UNIT/ML Solostar Pen Inject 20 Units into the skin daily.  Marland Kitchen lamoTRIgine (LAMICTAL) 100 MG tablet Take 100 mg by mouth 2 (two) times daily. Dr. Lilia Pro  . Lancets MISC 1 each by Does not apply route 4 (four) times daily -  before meals and at bedtime.  Marland Kitchen lisinopril (ZESTRIL) 20 MG tablet Take 1 tablet (20 mg total) by mouth daily.  Marland Kitchen lovastatin (MEVACOR) 40 MG tablet Take 1 tablet (40 mg total) by mouth at bedtime.  . metFORMIN (GLUCOPHAGE) 1000 MG tablet Take 1 tablet (1,000 mg total) by mouth 2 (two) times daily with a meal.  . Multiple Vitamins-Minerals (MULTIVITAMIN WITH MINERALS) tablet Take 1 tablet by mouth daily.  . naproxen (NAPROSYN) 250 MG tablet Take 250 mg by mouth 2 (two) times daily with a meal.  . omega-3 fish oil (MAXEPA) 1000 MG CAPS capsule Take 1 capsule by mouth daily.  . [DISCONTINUED] empagliflozin (JARDIANCE) 10 MG TABS tablet Take 10 mg by mouth daily.  . [DISCONTINUED] Insulin Glargine (LANTUS SOLOSTAR) 100 UNIT/ML Solostar Pen  Inject 10 Units into the skin every morning. And pen needles 1/day (Patient taking differently: Inject 20 Units into the skin every morning. And pen needles 1/day)  . [DISCONTINUED] lisinopril (ZESTRIL) 20 MG tablet Take 20 mg by mouth daily.  . [DISCONTINUED] lovastatin (MEVACOR) 40 MG tablet Take 40 mg by mouth at bedtime.    PHQ 2/9 Scores 11/12/2019 07/27/2018 04/13/2018 02/21/2018  PHQ - 2 Score 0 0 3 3  PHQ- 9 Score 2 - - 9    BP Readings from Last 3 Encounters:  11/12/19 112/70  07/27/18 (!) 136/100  04/16/18 (!) 140/100    Physical Exam Vitals and nursing note reviewed.  Constitutional:      General: He is not in acute distress.    Appearance: Normal appearance. He is well-developed.  HENT:     Head: Normocephalic and atraumatic.  Neck:     Vascular: No carotid bruit.  Cardiovascular:     Rate and Rhythm: Normal rate and regular rhythm.     Pulses: Normal pulses.     Heart sounds: No murmur.  Pulmonary:      Effort: Pulmonary effort is normal. No respiratory distress.     Breath sounds: No wheezing or rhonchi.  Musculoskeletal:        General: Normal range of motion.     Cervical back: Normal range of motion.     Right lower leg: No edema.     Left lower leg: No edema.  Lymphadenopathy:     Cervical: No cervical adenopathy.  Skin:    General: Skin is warm and dry.     Findings: No rash.  Neurological:     General: No focal deficit present.     Mental Status: He is alert and oriented to person, place, and time.  Psychiatric:        Mood and Affect: Mood normal.        Behavior: Behavior normal.        Thought Content: Thought content normal.        Judgment: Judgment normal.     Wt Readings from Last 3 Encounters:  11/12/19 201 lb (91.2 kg)  09/16/19 203 lb (92.1 kg)  09/11/19 201 lb (91.2 kg)    BP 112/70   Pulse 90   Temp 98.9 F (37.2 C) (Oral)   Ht _0  (1.575 m)   Wt 201 lb (91.2 kg)   SpO2 96%   BMI 36.76 kg/m   Assessment and Plan: 1. Type II diabetes mellitus with complication (HCC) Pt reports BS are moderately elevated on current regimen of lantus 20 u, jardiance 10 mg and metformin 1000 mg bid.  He does not like the increase in thirst and urination with Jardiance.  However, he does desire good DM control so will continue for now.  If A1C is < 6.0 would consider stopping Jardiance. - empagliflozin (JARDIANCE) 10 MG TABS tablet; Take 10 mg by mouth daily.  Dispense: 90 tablet; Refill: 1 - Insulin Glargine (LANTUS SOLOSTAR) 100 UNIT/ML Solostar Pen; Inject 20 Units into the skin daily.  Dispense: 15 pen; Refill: 1 - Comprehensive metabolic panel - Hemoglobin A1c - Insulin Pen Needle (PEN NEEDLES) 32G X 5 MM MISC; Place 1 each onto the skin daily.  Dispense: 100 each; Refill: 1  2. Hyperlipidemia associated with type 2 diabetes mellitus ([place]) Tolerating statin medication without side effects at this time LDL was not even close to goal of < 70 at last check seen in  the system  Continue same therapy without change at this time - will advise when labs return. - lovastatin (MEVACOR) 40 MG tablet; Take 1 tablet (40 mg total) by mouth at bedtime.  Dispense: 90 tablet; Refill: 1 - Lipid panel  3. Essential hypertension Clinically stable exam with well controlled BP on lisinopril. Tolerating medications without side effects at this time. Pt to continue current regimen and low sodium diet; benefits of regular exercise as able discussed. - lisinopril (ZESTRIL) 20 MG tablet; Take 1 tablet (20 mg total) by mouth daily.  Dispense: 90 tablet; Refill: 1 - CBC with Differential/Platelet  4. Chronic idiopathic constipation Recommend Miralax powder, daily to bid as needed  5. Bipolar 1 disorder ([place]) Stable per patient He will continue follow up with Psych and continue Lamictal.  6. 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 DDD (degenerative disc disease), lumbar No notes seen in the system but patient reports being treated by [facility] with gabapentin and flexeril He will follow up with them as planned.   Partially dictated using Editor, commissioning. Any errors are unintentional.  Halina Maidens, MD [facility]  11/12/2019

## 2019-11-13 ENCOUNTER — Other Ambulatory Visit: Payer: Self-pay | Admitting: Internal Medicine

## 2019-11-13 DIAGNOSIS — E1169 Type 2 diabetes mellitus with other specified complication: Secondary | ICD-10-CM

## 2019-11-13 DIAGNOSIS — E785 Hyperlipidemia, unspecified: Secondary | ICD-10-CM

## 2019-11-13 LAB — LIPID PANEL
Chol/HDL Ratio: 5.3 ratio — ABNORMAL HIGH (ref 0.0–5.0)
Cholesterol, Total: 224 mg/dL — ABNORMAL HIGH (ref 100–199)
HDL: 42 mg/dL (ref >39)
LDL Chol Calc (NIH): 134 mg/dL — ABNORMAL HIGH (ref 0–99)
Triglycerides: 269 mg/dL — ABNORMAL HIGH (ref 0–149)
VLDL Cholesterol Cal: 48 mg/dL — ABNORMAL HIGH (ref 5–40)

## 2019-11-13 LAB — CBC WITH DIFFERENTIAL/PLATELET
Basophils Absolute: 0.1 10*3/uL (ref 0.0–0.2)
Basos: 1 %
EOS (ABSOLUTE): 0.2 10*3/uL (ref 0.0–0.4)
Eos: 3 %
Hematocrit: 49.1 % (ref 37.5–51.0)
Hemoglobin: 16.9 g/dL (ref 13.0–17.7)
Immature Grans (Abs): 0 10*3/uL (ref 0.0–0.1)
Immature Granulocytes: 0 %
Lymphocytes Absolute: 2.5 10*3/uL (ref 0.7–3.1)
Lymphs: 39 %
MCH: 29.5 pg (ref 26.6–33.0)
MCHC: 34.4 g/dL (ref 31.5–35.7)
MCV: 86 fL (ref 79–97)
Monocytes Absolute: 0.5 10*3/uL (ref 0.1–0.9)
Monocytes: 8 %
Neutrophils Absolute: 3.2 10*3/uL (ref 1.4–7.0)
Neutrophils: 49 %
Platelets: 287 10*3/uL (ref 150–450)
RBC: 5.73 x10E6/uL (ref 4.14–5.80)
RDW: 13.1 % (ref 11.6–15.4)
WBC: 6.5 10*3/uL (ref 3.4–10.8)

## 2019-11-13 LAB — HEMOGLOBIN A1C
Est. average glucose Bld gHb Est-mCnc: 171 mg/dL
Hgb A1c MFr Bld: 7.6 % — ABNORMAL HIGH (ref 4.8–5.6)

## 2019-11-13 LAB — COMPREHENSIVE METABOLIC PANEL
ALT: 35 IU/L (ref 0–44)
AST: 18 IU/L (ref 0–40)
Albumin/Globulin Ratio: 2.2 (ref 1.2–2.2)
Albumin: 4.6 g/dL (ref 3.8–4.9)
Alkaline Phosphatase: 89 IU/L (ref 39–117)
BUN/Creatinine Ratio: 16 (ref 9–20)
BUN: 17 mg/dL (ref 6–24)
Bilirubin Total: 0.3 mg/dL (ref 0.0–1.2)
CO2: 23 mmol/L (ref 20–29)
Calcium: 10 mg/dL (ref 8.7–10.2)
Chloride: 102 mmol/L (ref 96–106)
Creatinine, Ser: 1.06 mg/dL (ref 0.76–1.27)
GFR calc Af Amer: 92 mL/min/{1.73_m2} (ref >59)
GFR calc non Af Amer: 80 mL/min/{1.73_m2} (ref >59)
Globulin, Total: 2.1 g/dL (ref 1.5–4.5)
Glucose: 111 mg/dL — ABNORMAL HIGH (ref 65–99)
Potassium: 4.7 mmol/L (ref 3.5–5.2)
Sodium: 140 mmol/L (ref 134–144)
Total Protein: 6.7 g/dL (ref 6.0–8.5)

## 2019-11-13 MED ORDER — ATORVASTATIN CALCIUM 10 MG PO TABS: 10.0000 mg | ORAL_TABLET | Freq: Every day | ORAL | 1 refills | Status: DC

## 2019-11-22 DIAGNOSIS — F411 Generalized anxiety disorder: Secondary | ICD-10-CM | POA: Diagnosis not present

## 2019-11-22 DIAGNOSIS — F3112 Bipolar disorder, current episode manic without psychotic features, moderate: Secondary | ICD-10-CM | POA: Diagnosis not present

## 2019-11-26 DIAGNOSIS — M5416 Radiculopathy, lumbar region: Secondary | ICD-10-CM | POA: Diagnosis not present

## 2019-11-26 DIAGNOSIS — M6281 Muscle weakness (generalized): Secondary | ICD-10-CM | POA: Diagnosis not present

## 2019-12-05 DIAGNOSIS — M4316 Spondylolisthesis, lumbar region: Secondary | ICD-10-CM | POA: Diagnosis not present

## 2019-12-05 DIAGNOSIS — M47816 Spondylosis without myelopathy or radiculopathy, lumbar region: Secondary | ICD-10-CM | POA: Diagnosis not present

## 2019-12-05 DIAGNOSIS — G8929 Other chronic pain: Secondary | ICD-10-CM | POA: Diagnosis not present

## 2019-12-05 DIAGNOSIS — M5441 Lumbago with sciatica, right side: Secondary | ICD-10-CM | POA: Diagnosis not present

## 2019-12-05 DIAGNOSIS — M5442 Lumbago with sciatica, left side: Secondary | ICD-10-CM | POA: Diagnosis not present

## 2019-12-06 ENCOUNTER — Other Ambulatory Visit: Payer: Self-pay | Admitting: Student

## 2019-12-06 DIAGNOSIS — M5442 Lumbago with sciatica, left side: Secondary | ICD-10-CM

## 2019-12-06 DIAGNOSIS — M5441 Lumbago with sciatica, right side: Secondary | ICD-10-CM

## 2019-12-13 DIAGNOSIS — Z20822 Contact with and (suspected) exposure to covid-19: Secondary | ICD-10-CM | POA: Diagnosis not present

## 2019-12-13 DIAGNOSIS — Z20828 Contact with and (suspected) exposure to other viral communicable diseases: Secondary | ICD-10-CM | POA: Diagnosis not present

## 2019-12-17 ENCOUNTER — Other Ambulatory Visit: Payer: Self-pay

## 2019-12-17 ENCOUNTER — Ambulatory Visit
Admission: RE | Admit: 2019-12-17 | Discharge: 2019-12-17 | Disposition: A | Discharge status: Home / Self Care | Payer: Medicaid Other | Source: Ambulatory Visit | Attending: Student | Admitting: Student

## 2019-12-17 DIAGNOSIS — M5442 Lumbago with sciatica, left side: Secondary | ICD-10-CM | POA: Diagnosis not present

## 2019-12-17 DIAGNOSIS — G8929 Other chronic pain: Secondary | ICD-10-CM | POA: Diagnosis not present

## 2019-12-17 DIAGNOSIS — M5441 Lumbago with sciatica, right side: Secondary | ICD-10-CM | POA: Insufficient documentation

## 2019-12-17 DIAGNOSIS — M545 Low back pain: Secondary | ICD-10-CM | POA: Diagnosis not present

## 2019-12-19 DIAGNOSIS — G8929 Other chronic pain: Secondary | ICD-10-CM | POA: Diagnosis not present

## 2019-12-19 DIAGNOSIS — M545 Low back pain: Secondary | ICD-10-CM | POA: Diagnosis not present

## 2020-01-09 DIAGNOSIS — M5136 Other intervertebral disc degeneration, lumbar region: Secondary | ICD-10-CM | POA: Diagnosis not present

## 2020-01-09 DIAGNOSIS — M48061 Spinal stenosis, lumbar region without neurogenic claudication: Secondary | ICD-10-CM | POA: Diagnosis not present

## 2020-01-09 DIAGNOSIS — M5416 Radiculopathy, lumbar region: Secondary | ICD-10-CM | POA: Diagnosis not present

## 2020-01-13 ENCOUNTER — Telehealth: Payer: Self-pay

## 2020-01-13 NOTE — Telephone Encounter (Signed)
We can discuss at his appt later this month.

## 2020-01-13 NOTE — Telephone Encounter (Signed)
Patient called and left a VM stating he was told by [facility] that he needs to get a steroid shot and then will later need surgery on his shoulder(?) - Was told by his specialist that he has to quit smoking.   Pt said he used Wellbutrin years ago and this helped him quit before. Wants to know if he can go back on this.  Please advise.  CM

## 2020-01-13 NOTE — Telephone Encounter (Signed)
Patient informed he can discuss at visit coming up- he said he is quitting this Thursday so that is not going to help him. Offered to give him a sooner appt but before I could finish my sentence he said " thank you " and hung up.  CM

## 2020-01-24 DIAGNOSIS — E118 Type 2 diabetes mellitus with unspecified complications: Secondary | ICD-10-CM | POA: Diagnosis not present

## 2020-01-24 DIAGNOSIS — M5136 Other intervertebral disc degeneration, lumbar region: Secondary | ICD-10-CM | POA: Diagnosis not present

## 2020-01-24 DIAGNOSIS — M5416 Radiculopathy, lumbar region: Secondary | ICD-10-CM | POA: Diagnosis not present

## 2020-01-29 ENCOUNTER — Other Ambulatory Visit: Payer: Self-pay

## 2020-01-29 ENCOUNTER — Other Ambulatory Visit: Payer: Self-pay | Admitting: Internal Medicine

## 2020-01-29 ENCOUNTER — Ambulatory Visit (INDEPENDENT_AMBULATORY_CARE_PROVIDER_SITE_OTHER): Payer: Medicaid Other | Admitting: Internal Medicine

## 2020-01-29 ENCOUNTER — Encounter: Payer: Self-pay | Admitting: Internal Medicine

## 2020-01-29 VITALS — BP 112/70 | HR 101 | Temp 96.6°F | Ht 62.0 in | Wt 196.0 lb

## 2020-01-29 DIAGNOSIS — Z1211 Encounter for screening for malignant neoplasm of colon: Secondary | ICD-10-CM

## 2020-01-29 DIAGNOSIS — I10 Essential (primary) hypertension: Secondary | ICD-10-CM

## 2020-01-29 DIAGNOSIS — M5136 Other intervertebral disc degeneration, lumbar region: Secondary | ICD-10-CM

## 2020-01-29 DIAGNOSIS — E1169 Type 2 diabetes mellitus with other specified complication: Secondary | ICD-10-CM

## 2020-01-29 DIAGNOSIS — E118 Type 2 diabetes mellitus with unspecified complications: Secondary | ICD-10-CM | POA: Diagnosis not present

## 2020-01-29 DIAGNOSIS — E785 Hyperlipidemia, unspecified: Secondary | ICD-10-CM

## 2020-01-29 NOTE — Patient Instructions (Signed)
Covid Vaccine phone numbers: [place] [phone]  [phone]   

## 2020-01-29 NOTE — Progress Notes (Signed)
Date:  01/29/2020   Name:  Tommy House   DOB:  [birth year]   MRN:  [id number]   Chief Complaint: Hypertension and Diabetes  Hypertension This is a chronic problem. The problem is controlled. Pertinent negatives include no chest pain, headaches, palpitations or shortness of breath. Past treatments include ACE inhibitors. The current treatment provides significant improvement.  Diabetes He presents for his follow-up diabetic visit. He has type 2 diabetes mellitus. His disease course has been stable. Pertinent negatives for hypoglycemia include no headaches or tremors. Pertinent negatives for diabetes include no chest pain, no fatigue, no polydipsia and no polyuria. Current diabetic treatment includes insulin injections (jardiance and metformin). He is compliant with treatment all of the time. An ACE inhibitor/angiotensin II receptor blocker is being taken. Eye exam is current.  Hyperlipidemia This is a chronic problem. The problem is resistant (statin changed to a more potent agent last visit). Pertinent negatives include no chest pain or shortness of breath. There are no compliance problems.     There is no immunization history on file for this patient.  Lab Results  Component Value Date   CREATININE 1.06 11/12/2019   BUN 17 11/12/2019   NA 140 11/12/2019   K 4.7 11/12/2019   CL 102 11/12/2019   CO2 23 11/12/2019   Lab Results  Component Value Date   CHOL 224 (H) 11/12/2019   HDL 42 11/12/2019   LDLCALC 134 (H) 11/12/2019   TRIG 269 (H) 11/12/2019   CHOLHDL 5.3 (H) 11/12/2019   Lab Results  Component Value Date   TSH 4.990 (H) 04/13/2018   Lab Results  Component Value Date   HGBA1C 7.6 (H) 11/12/2019   Lab Results  Component Value Date   WBC 6.5 11/12/2019   HGB 16.9 11/12/2019   HCT 49.1 11/12/2019   MCV 86 11/12/2019   PLT 287 11/12/2019   Lab Results  Component Value Date   ALT 35 11/12/2019   AST 18 11/12/2019   ALKPHOS 89 11/12/2019   BILITOT 0.3  11/12/2019     Review of Systems  Constitutional: Negative for appetite change, fatigue and unexpected weight change.  Eyes: Negative for visual disturbance.  Respiratory: Negative for cough, shortness of breath and wheezing.   Cardiovascular: Negative for chest pain, palpitations and leg swelling.  Gastrointestinal: Negative for abdominal pain and blood in stool.  Endocrine: Negative for polydipsia and polyuria.  Genitourinary: Negative for dysuria and hematuria.  Skin: Negative for color change and rash.  Neurological: Negative for tremors, numbness and headaches.  Psychiatric/Behavioral: Negative for dysphoric mood.    Patient Active Problem List   Diagnosis Date Noted  . Type II diabetes mellitus with complication ([place]) 75/64/3329  . Essential hypertension 11/12/2019  . Chronic idiopathic constipation 11/12/2019  . Bipolar 1 disorder ([place]) 11/12/2019  . DDD (degenerative disc disease), lumbar 11/12/2019  . 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 Hyperlipidemia associated with type 2 diabetes mellitus ([place]) 12/06/2017    No Known Allergies  Past Surgical History:  Procedure Laterality Date  . HERNIA REPAIR    . WISDOM TOOTH EXTRACTION      Social History   Tobacco Use  . Smoking status: Former Smoker    Packs/day: 1.00    Years: 30.00    Pack years: 30.00    Types: Cigarettes    Quit date: 04/10/2019    Years since quitting: 0.8  . Smokeless tobacco: Former Systems developer    Types: Loss adjuster, chartered  Quit date: 04/10/2019  Substance Use Topics  . Alcohol use: No  . Drug use: Not Currently    Comment: Former drug and alcohol use.     Medication list has been reviewed and updated.  Current Meds  Medication Sig  . Apple Cider Vinegar 500 MG TABS Take 1 tablet by mouth daily.  Marland Kitchen atorvastatin (LIPITOR) 10 MG tablet Take 1 tablet (10 mg total) by mouth daily.  . Blood Glucose Monitoring Suppl (ACCU-CHEK AVIVA PLUS) w/Device KIT 1 each by Does  not apply route 3 (three) times daily.  . cyclobenzaprine (FLEXERIL) 10 MG tablet Take 10 mg by mouth at bedtime. Dr. Ronnald Ramp- [facility]  . docusate sodium (COLACE) 100 MG capsule Take 100 mg by mouth 2 (two) times daily.  . empagliflozin (JARDIANCE) 10 MG TABS tablet Take 10 mg by mouth daily.  Marland Kitchen glucose blood (ACCU-CHEK AVIVA) test strip Use Three times daily as directed  . Insulin Glargine (LANTUS SOLOSTAR) 100 UNIT/ML Solostar Pen Inject 20 Units into the skin daily.  . Insulin Pen Needle (PEN NEEDLES) 32G X 5 MM MISC Place 1 each onto the skin daily.  Marland Kitchen lamoTRIgine (LAMICTAL) 100 MG tablet Take 100 mg by mouth 2 (two) times daily. Dr. Lilia Pro  . Lancets MISC 1 each by Does not apply route 4 (four) times daily -  before meals and at bedtime.  Marland Kitchen lisinopril (ZESTRIL) 20 MG tablet Take 1 tablet (20 mg total) by mouth daily.  . metFORMIN (GLUCOPHAGE) 1000 MG tablet Take 1 tablet (1,000 mg total) by mouth 2 (two) times daily with a meal.  . Multiple Vitamins-Minerals (MULTIVITAMIN WITH MINERALS) tablet Take 1 tablet by mouth daily.  . naproxen (NAPROSYN) 250 MG tablet Take 250 mg by mouth 2 (two) times daily with a meal.    PHQ 2/9 Scores 01/29/2020 11/12/2019 07/27/2018 04/13/2018  PHQ - 2 Score 0 0 0 3  PHQ- 9 Score 0 2 - -    BP Readings from Last 3 Encounters:  01/29/20 112/70  11/12/19 112/70  07/27/18 (!) 136/100    Physical Exam Vitals and nursing note reviewed.  Constitutional:      General: He is not in acute distress.    Appearance: He is well-developed.  HENT:     Head: Normocephalic and atraumatic.  Cardiovascular:     Rate and Rhythm: Normal rate and regular rhythm.     Pulses: Normal pulses.  Pulmonary:     Effort: Pulmonary effort is normal. No respiratory distress.     Breath sounds: No wheezing or rhonchi.  Musculoskeletal:     Cervical back: Normal range of motion.     Right lower leg: No edema.     Left lower leg: No edema.  Skin:    General: Skin is warm and  dry.     Capillary Refill: Capillary refill takes less than 2 seconds.     Findings: No rash.  Neurological:     Mental Status: He is alert and oriented to person, place, and time.  Psychiatric:        Behavior: Behavior normal.        Thought Content: Thought content normal.     Wt Readings from Last 3 Encounters:  01/29/20 196 lb (88.9 kg)  11/12/19 201 lb (91.2 kg)  09/16/19 203 lb (92.1 kg)    BP 112/70   Pulse (!) 101   Temp (!) 96.6 F (35.9 C) (Temporal)   Ht '5\' 2"'  (1.856 m)   Wt  196 lb (88.9 kg)   SpO2 97%   BMI 35.85 kg/m   Assessment and Plan: 1. Essential hypertension Clinically stable exam with well controlled BP. Tolerating medications without side effects at this time. Pt to continue current regimen and low sodium diet; benefits of regular exercise as able discussed.  2. Type II diabetes mellitus with complication ([place]) Clinically stable by exam and report without s/s of hypoglycemia. DM complicated by htn and lipids. Tolerating medications well without side effects or other concerns. Recently increased FSBS due to steroid injection - Hemoglobin A1c  3. Hyperlipidemia associated with type 2 diabetes mellitus ([place]) Tolerating change to high intensity statin medication without side effects at this time Will recheck labs - Comprehensive metabolic panel - Lipid panel  4. DDD (degenerative disc disease), lumbar Seen by Orthopedics and had recent ESI  5. Colon cancer screening Last colonoscopy about 5 years ago but no record available Will get FIT since he may soon be leaving for the [place] - Fecal occult blood, imunochemical   Partially dictated using Editor, commissioning. Any errors are unintentional.  Halina Maidens, MD [facility]  01/29/2020

## 2020-01-30 LAB — LIPID PANEL
Chol/HDL Ratio: 3.8 ratio (ref 0.0–5.0)
Cholesterol, Total: 155 mg/dL (ref 100–199)
HDL: 41 mg/dL (ref >39)
LDL Chol Calc (NIH): 69 mg/dL (ref 0–99)
Triglycerides: 282 mg/dL — ABNORMAL HIGH (ref 0–149)
VLDL Cholesterol Cal: 45 mg/dL — ABNORMAL HIGH (ref 5–40)

## 2020-01-30 LAB — COMPREHENSIVE METABOLIC PANEL
ALT: 35 IU/L (ref 0–44)
AST: 15 IU/L (ref 0–40)
Albumin/Globulin Ratio: 2.6 — ABNORMAL HIGH (ref 1.2–2.2)
Albumin: 4.5 g/dL (ref 3.8–4.9)
Alkaline Phosphatase: 76 IU/L (ref 39–117)
BUN/Creatinine Ratio: 16 (ref 9–20)
BUN: 15 mg/dL (ref 6–24)
Bilirubin Total: 0.7 mg/dL (ref 0.0–1.2)
CO2: 22 mmol/L (ref 20–29)
Calcium: 9.5 mg/dL (ref 8.7–10.2)
Chloride: 100 mmol/L (ref 96–106)
Creatinine, Ser: 0.96 mg/dL (ref 0.76–1.27)
GFR calc Af Amer: 104 mL/min/{1.73_m2} (ref >59)
GFR calc non Af Amer: 90 mL/min/{1.73_m2} (ref >59)
Globulin, Total: 1.7 g/dL (ref 1.5–4.5)
Glucose: 153 mg/dL — ABNORMAL HIGH (ref 65–99)
Potassium: 4.7 mmol/L (ref 3.5–5.2)
Sodium: 138 mmol/L (ref 134–144)
Total Protein: 6.2 g/dL (ref 6.0–8.5)

## 2020-01-30 LAB — HEMOGLOBIN A1C
Est. average glucose Bld gHb Est-mCnc: 143 mg/dL
Hgb A1c MFr Bld: 6.6 % — ABNORMAL HIGH (ref 4.8–5.6)

## 2020-02-10 DIAGNOSIS — F411 Generalized anxiety disorder: Secondary | ICD-10-CM | POA: Diagnosis not present

## 2020-02-10 DIAGNOSIS — F3112 Bipolar disorder, current episode manic without psychotic features, moderate: Secondary | ICD-10-CM | POA: Diagnosis not present

## 2020-02-27 DIAGNOSIS — M47816 Spondylosis without myelopathy or radiculopathy, lumbar region: Secondary | ICD-10-CM | POA: Diagnosis not present

## 2020-02-27 DIAGNOSIS — M5416 Radiculopathy, lumbar region: Secondary | ICD-10-CM | POA: Diagnosis not present

## 2020-02-27 DIAGNOSIS — M48061 Spinal stenosis, lumbar region without neurogenic claudication: Secondary | ICD-10-CM | POA: Diagnosis not present

## 2020-02-27 DIAGNOSIS — M5136 Other intervertebral disc degeneration, lumbar region: Secondary | ICD-10-CM | POA: Diagnosis not present

## 2020-03-12 DIAGNOSIS — M47816 Spondylosis without myelopathy or radiculopathy, lumbar region: Secondary | ICD-10-CM | POA: Diagnosis not present

## 2020-03-12 DIAGNOSIS — E118 Type 2 diabetes mellitus with unspecified complications: Secondary | ICD-10-CM | POA: Diagnosis not present

## 2020-04-15 ENCOUNTER — Ambulatory Visit (INDEPENDENT_AMBULATORY_CARE_PROVIDER_SITE_OTHER): Payer: Medicaid Other | Admitting: Internal Medicine

## 2020-04-15 ENCOUNTER — Other Ambulatory Visit: Payer: Self-pay

## 2020-04-15 ENCOUNTER — Encounter: Payer: Self-pay | Admitting: Internal Medicine

## 2020-04-15 VITALS — BP 130/68 | HR 89 | Ht 62.0 in | Wt 199.0 lb

## 2020-04-15 DIAGNOSIS — E118 Type 2 diabetes mellitus with unspecified complications: Secondary | ICD-10-CM

## 2020-04-15 DIAGNOSIS — L989 Disorder of the skin and subcutaneous tissue, unspecified: Secondary | ICD-10-CM

## 2020-04-15 MED ORDER — METFORMIN HCL 1000 MG PO TABS: 1000.0000 mg | ORAL_TABLET | Freq: Two times a day (BID) | ORAL | 0 refills | Status: AC

## 2020-04-15 MED ORDER — LANTUS SOLOSTAR 100 UNIT/ML ~~LOC~~ SOPN: 20.0000 [IU] | PEN_INJECTOR | Freq: Every day | SUBCUTANEOUS | 0 refills | Status: AC

## 2020-04-15 NOTE — Progress Notes (Signed)
Date:  04/15/2020   Name:  Tommy House   DOB:  07-26-1966   MRN:  [id number]  He is planning to move to [place] permanently in the very near future.  Chief Complaint: Foot Pain (Sore on the bottom left foot. Painful and sore to walk on. Thinks he stepped on something and never removed it from the inside of his foot. ) and skin color change (4 years now. Splotchy discoloration. Not painful or itchy. All over body on skin. )  Foot Injury  Incident onset: noticed about 2 weeks ago. There was no injury mechanism. The pain is present in the left foot. Quality: pain like a pebble. The pain has been constant since onset.    Lab Results  Component Value Date   CREATININE 0.96 01/29/2020   BUN 15 01/29/2020   NA 138 01/29/2020   K 4.7 01/29/2020   CL 100 01/29/2020   CO2 22 01/29/2020   Lab Results  Component Value Date   CHOL 155 01/29/2020   HDL 41 01/29/2020   LDLCALC 69 01/29/2020   TRIG 282 (H) 01/29/2020   CHOLHDL 3.8 01/29/2020   Lab Results  Component Value Date   TSH 4.990 (H) 04/13/2018   Lab Results  Component Value Date   HGBA1C 6.6 (H) 01/29/2020   Lab Results  Component Value Date   WBC 6.5 11/12/2019   HGB 16.9 11/12/2019   HCT 49.1 11/12/2019   MCV 86 11/12/2019   PLT 287 11/12/2019   Lab Results  Component Value Date   ALT 35 01/29/2020   AST 15 01/29/2020   ALKPHOS 76 01/29/2020   BILITOT 0.7 01/29/2020    Review of Systems  Constitutional: Negative for chills, fatigue and fever.  Respiratory: Negative for chest tightness and shortness of breath.   Cardiovascular: Negative for chest pain and palpitations.  Gastrointestinal: Negative for abdominal pain and blood in stool.  Musculoskeletal: Positive for gait problem.  Neurological: Negative for dizziness, light-headedness and headaches.  Psychiatric/Behavioral: Negative for dysphoric mood and sleep disturbance. The patient is not nervous/anxious.     Patient Active Problem List   Diagnosis  Date Noted  . Type II diabetes mellitus with complication ([place]) 49/44/9675  . Essential hypertension 11/12/2019  . Chronic idiopathic constipation 11/12/2019  . Bipolar 1 disorder ([place]) 11/12/2019  . DDD (degenerative disc disease), lumbar 11/12/2019  . 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 Hyperlipidemia associated with type 2 diabetes mellitus ([place]) 12/06/2017    No Known Allergies  Past Surgical History:  Procedure Laterality Date  . HERNIA REPAIR    . WISDOM TOOTH EXTRACTION      Social History   Tobacco Use  . Smoking status: Former Smoker    Packs/day: 1.00    Years: 30.00    Pack years: 30.00    Types: Cigarettes    Quit date: 04/10/2019    Years since quitting: 1.0  . Smokeless tobacco: Former Systems developer    Types: [place] date: 04/10/2019  Vaping Use  . Vaping Use: Former  . Quit date: 04/10/2019  Substance Use Topics  . Alcohol use: No  . Drug use: Not Currently    Comment: Former drug and alcohol use.     Medication list has been reviewed and updated.  Current Meds  Medication Sig  . Apple Cider Vinegar 500 MG TABS Take 1 tablet by mouth daily. Takes [place]  . atorvastatin (LIPITOR) 10 MG tablet Take  1 tablet (10 mg total) by mouth daily.  . Blood Glucose Monitoring Suppl (ACCU-CHEK AVIVA PLUS) w/Device KIT 1 each by Does not apply route 3 (three) times daily.  Marland Kitchen docusate sodium (COLACE) 100 MG capsule Take 100 mg by mouth 2 (two) times daily.  . empagliflozin (JARDIANCE) 10 MG TABS tablet Take 10 mg by mouth daily.  Marland Kitchen glucose blood (ACCU-CHEK AVIVA) test strip Use Three times daily as directed  . Insulin Glargine (LANTUS SOLOSTAR) 100 UNIT/ML Solostar Pen Inject 20 Units into the skin daily.  . Insulin Pen Needle (PEN NEEDLES) 32G X 5 MM MISC Place 1 each onto the skin daily.  Marland Kitchen lamoTRIgine (LAMICTAL) 100 MG tablet Take 100 mg by mouth 2 (two) times daily. Dr. Lilia Pro  . Lancets MISC 1 each by Does not apply  route 4 (four) times daily -  before meals and at bedtime.  Marland Kitchen lisinopril (ZESTRIL) 20 MG tablet Take 1 tablet (20 mg total) by mouth daily.  . metFORMIN (GLUCOPHAGE) 1000 MG tablet Take 1 tablet (1,000 mg total) by mouth 2 (two) times daily with a meal.  . Multiple Vitamins-Minerals (MULTIVITAMIN WITH MINERALS) tablet Take 1 tablet by mouth daily.  . naproxen (NAPROSYN) 250 MG tablet Take 250 mg by mouth 2 (two) times daily with a meal.    PHQ 2/9 Scores 04/15/2020 01/29/2020 11/12/2019 07/27/2018  PHQ - 2 Score 0 0 0 0  PHQ- 9 Score 0 0 2 -    GAD 7 : Generalized Anxiety Score 04/15/2020 02/21/2018  Nervous, Anxious, on Edge 0 2  Control/stop worrying 0 2  Worry too much - different things 0 2  Trouble relaxing 0 3  Restless 0 2  Easily annoyed or irritable 0 0  Afraid - awful might happen 0 0  Total GAD 7 Score 0 11  Anxiety Difficulty Not difficult at all -    BP Readings from Last 3 Encounters:  04/15/20 130/68  01/29/20 112/70  11/12/19 112/70    Physical Exam Vitals and nursing note reviewed.  Constitutional:      General: He is not in acute distress.    Appearance: He is well-developed.  HENT:     Head: Normocephalic and atraumatic.  Pulmonary:     Effort: Pulmonary effort is normal. No respiratory distress.  Musculoskeletal:        General: Normal range of motion.  Skin:    General: Skin is warm and dry.     Findings: No rash.  Neurological:     Mental Status: He is alert and oriented to person, place, and time.  Psychiatric:        Behavior: Behavior normal.        Thought Content: Thought content normal.     Wt Readings from Last 3 Encounters:  04/15/20 199 lb (90.3 kg)  01/29/20 196 lb (88.9 kg)  11/12/19 201 lb (91.2 kg)    BP 130/68 (BP Location: Right Arm, Patient Position: Sitting, Cuff Size: Large)   Pulse 89   Ht '5\' 2"'  (1.575 m)   Wt 199 lb (90.3 kg)   SpO2 98%   BMI 36.40 kg/m   Assessment and Plan: 1. Skin lesion of foot Suspect this is  a plantar wart - Ambulatory referral to Podiatry  2. Type II diabetes mellitus with complication ([facility]) Doing well with good control. He can transfer meds [facility] when he moves Sample of [place] given (several of his lantus pens have broken) - metFORMIN (GLUCOPHAGE) 1000 MG tablet;  Take 1 tablet (1,000 mg total) by mouth 2 (two) times daily with a meal.  Dispense: 180 tablet; Refill: 0 - insulin glargine (LANTUS SOLOSTAR) 100 UNIT/ML Solostar Pen; Inject 20 Units into the skin daily.  Dispense: 15 pen; Refill: 0   Partially dictated using Editor, commissioning. Any errors are unintentional.  Halina Maidens, MD [facility]  04/15/2020

## 2020-04-24 DIAGNOSIS — F3112 Bipolar disorder, current episode manic without psychotic features, moderate: Secondary | ICD-10-CM | POA: Diagnosis not present

## 2020-04-24 DIAGNOSIS — F411 Generalized anxiety disorder: Secondary | ICD-10-CM | POA: Diagnosis not present

## 2020-04-27 DIAGNOSIS — M48061 Spinal stenosis, lumbar region without neurogenic claudication: Secondary | ICD-10-CM | POA: Diagnosis not present

## 2020-04-27 DIAGNOSIS — M5416 Radiculopathy, lumbar region: Secondary | ICD-10-CM | POA: Diagnosis not present

## 2020-04-27 DIAGNOSIS — M5136 Other intervertebral disc degeneration, lumbar region: Secondary | ICD-10-CM | POA: Diagnosis not present

## 2020-04-27 DIAGNOSIS — M47816 Spondylosis without myelopathy or radiculopathy, lumbar region: Secondary | ICD-10-CM | POA: Diagnosis not present

## 2020-05-13 ENCOUNTER — Other Ambulatory Visit: Payer: Self-pay | Admitting: Internal Medicine

## 2020-05-13 DIAGNOSIS — E1169 Type 2 diabetes mellitus with other specified complication: Secondary | ICD-10-CM

## 2020-05-13 DIAGNOSIS — I10 Essential (primary) hypertension: Secondary | ICD-10-CM

## 2021-05-06 IMAGING — MR MR LUMBAR SPINE W/O CM
5 series · 31 of 48 positions shown · non-contrast
Comparison: None.

CLINICAL DATA: Chronic low back pain with bilateral sciatica

EXAM:
MRI LUMBAR SPINE WITHOUT CONTRAST
TECHNIQUE: Multiplanar, multisequence MR imaging of the lumbar spine was
performed. No intravenous contrast was administered.

[Series 5: T2 · sagittal · 4.0mm · 0.81mm/px · 6 of 17 slices shown (1 of 2)]
[im 1/17]
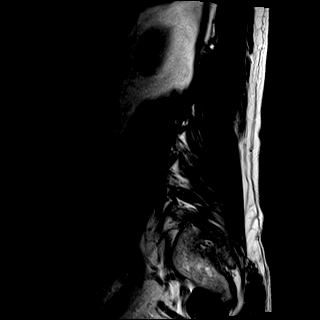
[im 4/17]
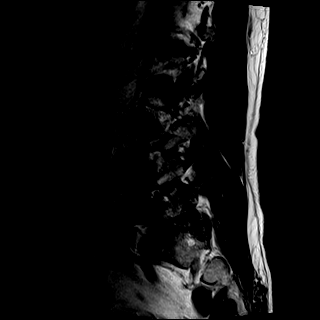
[im 7/17]
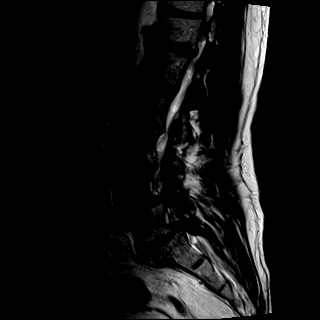
[im 10/17]
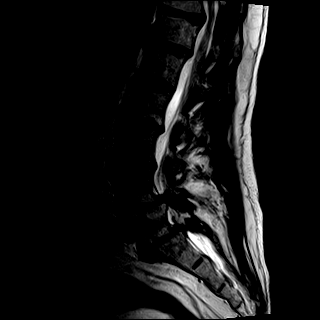
[im 13/17]
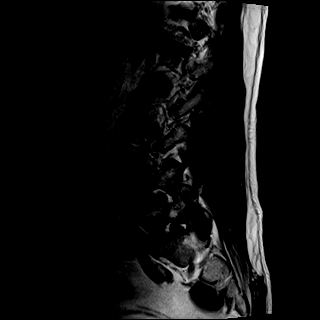
[im 17/17]
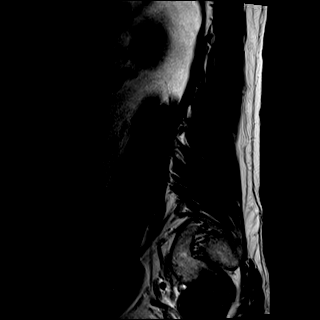

[Series 6: T1 · sagittal · 4.0mm · 0.81mm/px · 6 of 17 slices shown (1 of 2)]
[im 1/17]
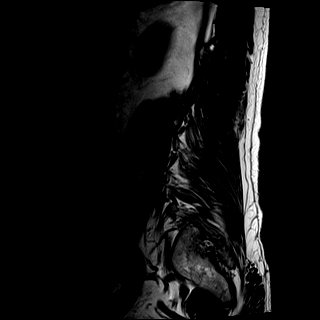
[im 4/17]
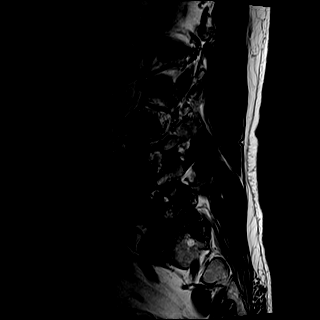
[im 7/17]
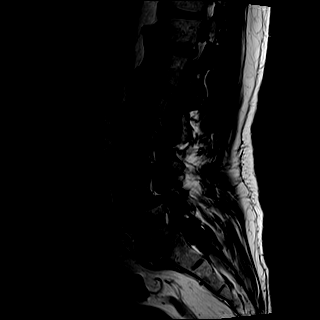
[im 10/17]
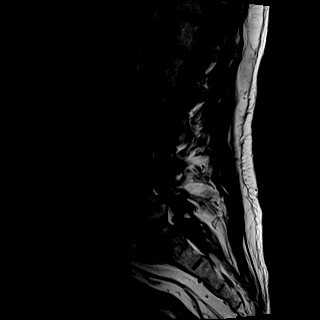
[im 13/17]
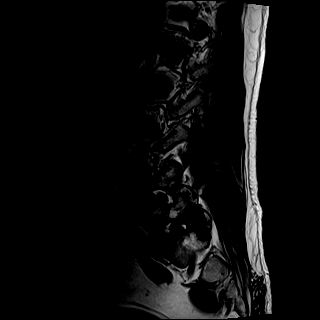
[im 17/17]
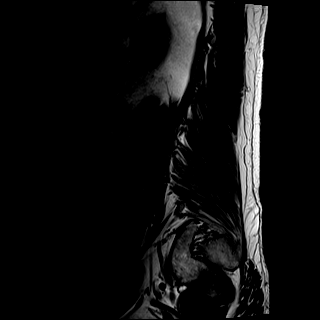

[Series 7: STIR · sagittal · 4.0mm · 0.41mm/px · 1 of 17 slices shown]
[im 1/17]
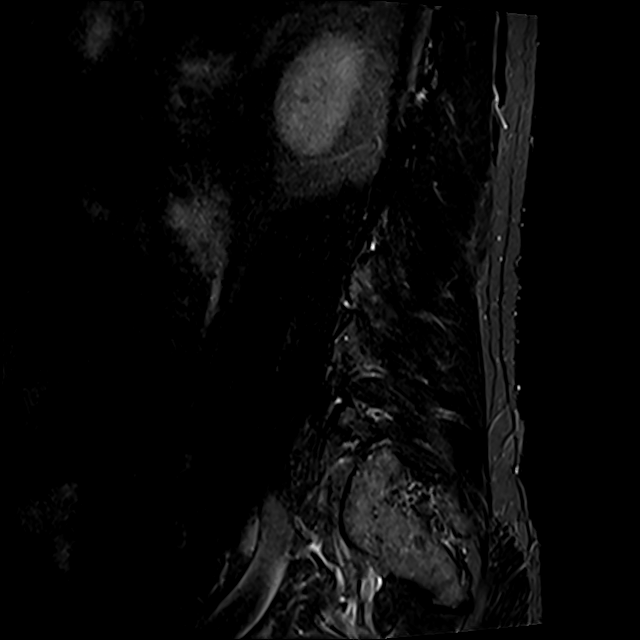

[Series 8: T2 · axial · 4.0mm · 0.78mm/px · z∈[-19,+222]mm · 9 of 39 slices shown (2 of 2)]
[im 1/39]
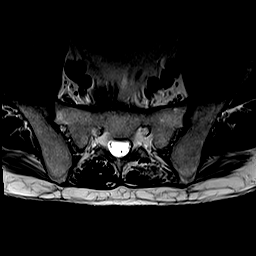
[im 6/39]
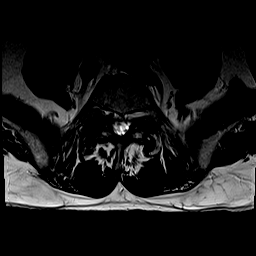
[im 11/39]
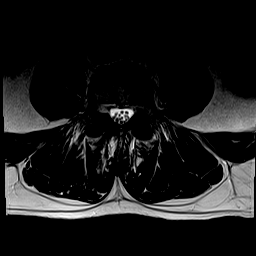
[im 17/39]
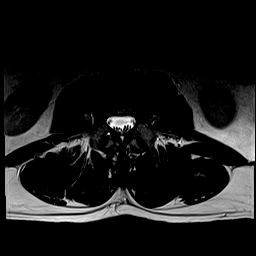
[im 20/39]
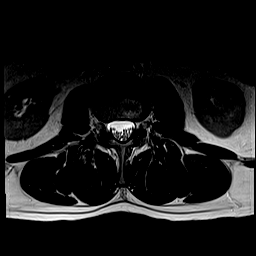
[im 22/39]
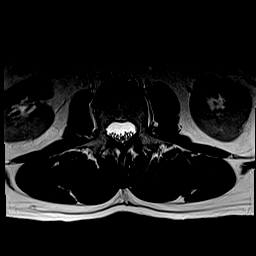
[im 28/39]
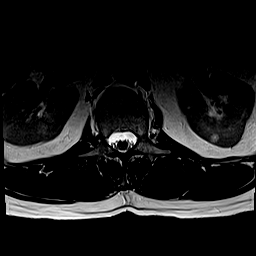
[im 33/39]
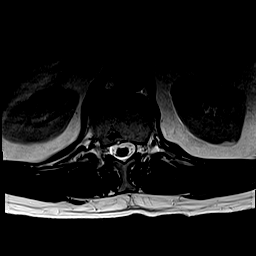
[im 39/39]
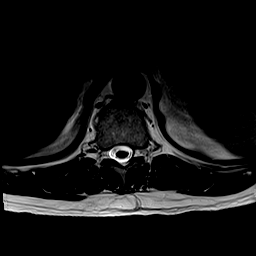

[Series 9: T1 · axial · 4.0mm · 0.39mm/px · z∈[-19,+222]mm · 9 of 39 slices shown (2 of 2)]
[im 1/39]
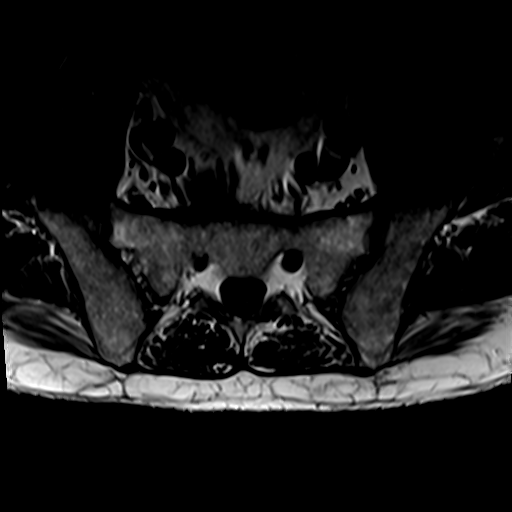
[im 6/39]
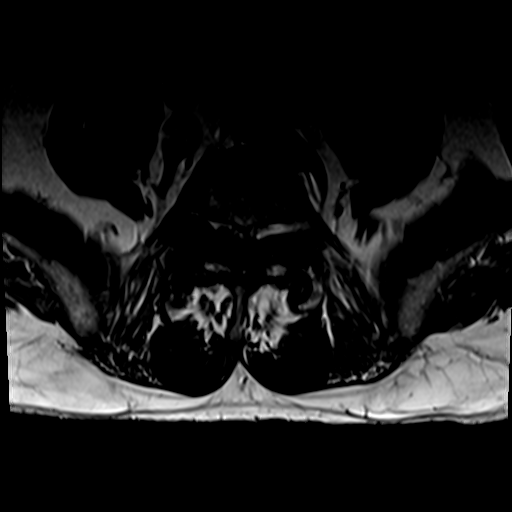
[im 11/39]
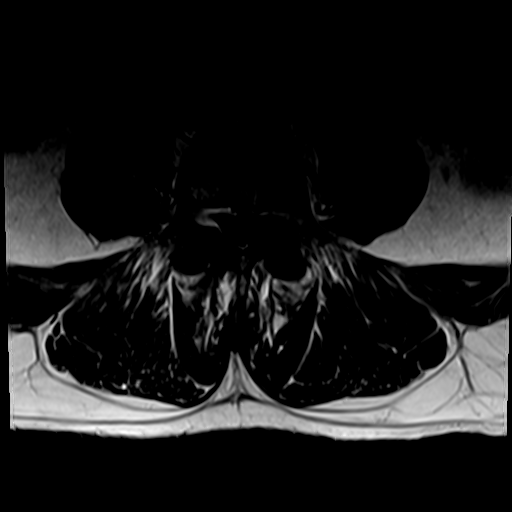
[im 17/39]
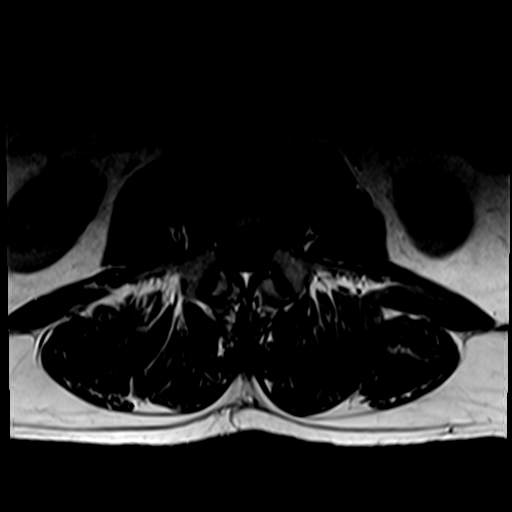
[im 20/39]
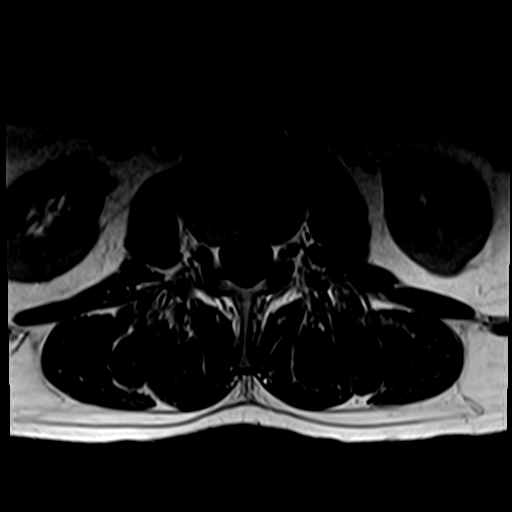
[im 22/39]
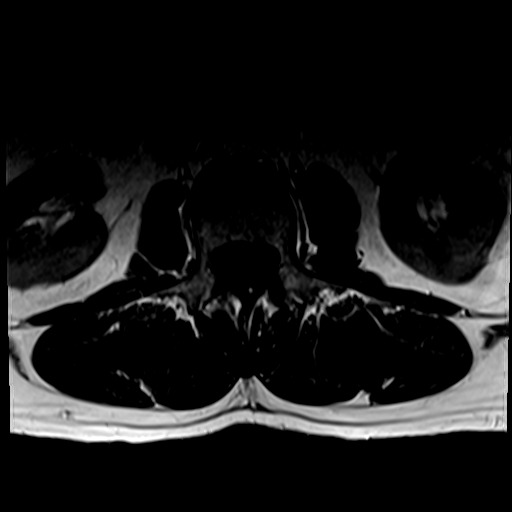
[im 28/39]
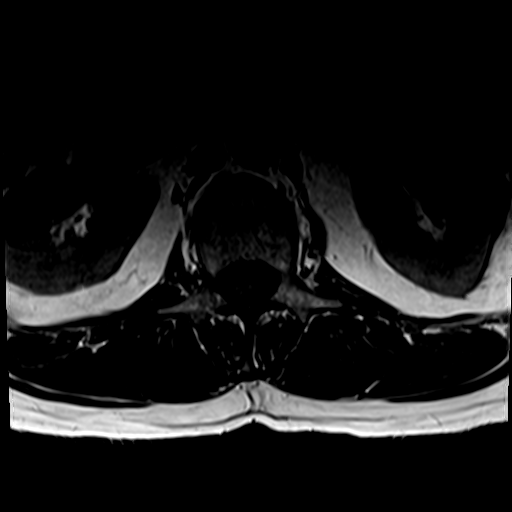
[im 33/39]
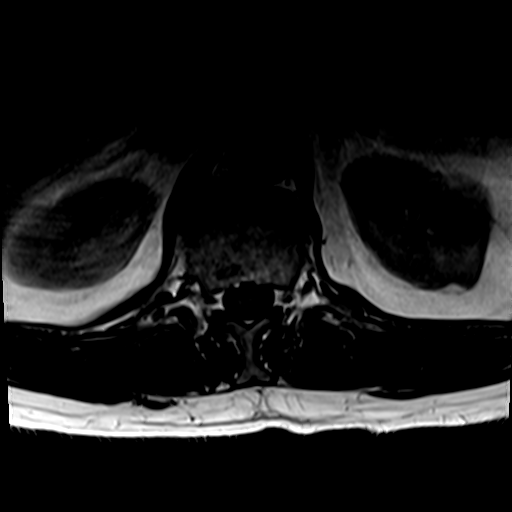
[im 39/39]
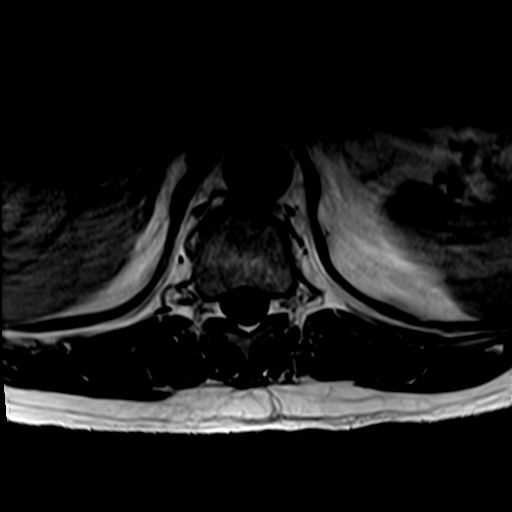

[31 of 48 positions shown; findings below may reference images not displayed]

FINDINGS: Segmentation:  Standard.

Alignment: There is a grade 1 anterolisthesis of L4 over L5 and L5
over S1.

Vertebrae: No fracture, evidence of discitis, or bone lesion.
Endplate degenerative changes more pronounced at L4-5 and L5-S1.

Conus medullaris and cauda equina: Conus extends to the L2 level.
Conus and cauda equina appear normal.

Paraspinal and other soft tissues: 7 mm left renal cyst.

Disc levels:

T11-T12: Small posterior disc protrusion causing flattening of the
anterior cord and mild spinal canal stenosis. No neural foraminal
narrowing.

T12-L1: Right central disc protrusion and facet degenerative changes
resulting in mild spinal canal stenosis. No neural foraminal
narrowing.

L1-2: No spinal canal or neural foraminal stenosis.

L2-3: Disc bulge, facet degenerative change and ligamentum flavum
redundancy resulting in mild spinal canal stenosis and mild
bilateral neural foraminal narrowing.

L3-4: Disc bulge, facet degenerative changes and ligamentum flavum
redundancy resulting in mild spinal canal stenosis and mild
bilateral neural foraminal narrowing.

L4-5: Disc bulge and prominent facet degenerative changes and
ligamentum flavum redundancy resulting in severe spinal canal
stenosis and mild bilateral neural foraminal narrowing.

L5-S1: Disc bulge with osteophytic component and prominent facet
degenerative changes and ligamentum flavum redundancy resulting in
moderate spinal canal stenosis, moderate right and severe left
neural foraminal narrowing with impingement on the exiting left L5
nerve root.
IMPRESSION: 1. Multilevel degenerative changes of the lumbar spine with severe
spinal canal stenosis at L4-5 and moderate spinal canal stenosis at
L5-S1.
2. Severe left neural foraminal narrowing at L5-S1 with impingement
on the exiting left L5 nerve root.
3. Mild spinal canal stenosis at T11-T12, L2-3 and L3-4.

## 2021-11-03 ENCOUNTER — Telehealth: Payer: Self-pay

## 2021-11-03 NOTE — Telephone Encounter (Signed)
Transition Care Management Unsuccessful Follow-up Telephone Call  Date of discharge and from where:  11/02/2021 from [facility]  Attempts:  1st Attempt  Reason for unsuccessful TCM follow-up call:  Left voice message

## 2021-11-05 NOTE — Telephone Encounter (Signed)
Transition Care Management Unsuccessful Follow-up Telephone Call  Date of discharge and from where:  11/02/2021 from [facility]  Attempts:  2nd Attempt  Reason for unsuccessful TCM follow-up call:  Left voice message

## 2021-11-08 NOTE — Telephone Encounter (Signed)
Transition Care Management Unsuccessful Follow-up Telephone Call  Date of discharge and from where:  11/02/2021 - [facility]  Attempts:  3rd Attempt  Reason for unsuccessful TCM follow-up call:  Left voice message
